# Patient Record
Sex: Female | Born: 1950 | Race: White | Hispanic: No | Smoking: Never smoker
Health system: Southern US, Community
[De-identification: ages and names within clinical notes are randomized; demographics above are authoritative.]

## PROBLEM LIST (undated history)

## (undated) DIAGNOSIS — D649 Anemia, unspecified: Secondary | ICD-10-CM

## (undated) DIAGNOSIS — T8859XA Other complications of anesthesia, initial encounter: Secondary | ICD-10-CM

## (undated) DIAGNOSIS — Z8489 Family history of other specified conditions: Secondary | ICD-10-CM

## (undated) DIAGNOSIS — Z9889 Other specified postprocedural states: Secondary | ICD-10-CM

## (undated) DIAGNOSIS — I10 Essential (primary) hypertension: Secondary | ICD-10-CM

## (undated) DIAGNOSIS — E039 Hypothyroidism, unspecified: Secondary | ICD-10-CM

## (undated) DIAGNOSIS — T4145XA Adverse effect of unspecified anesthetic, initial encounter: Secondary | ICD-10-CM

## (undated) DIAGNOSIS — F419 Anxiety disorder, unspecified: Secondary | ICD-10-CM

## (undated) DIAGNOSIS — R51 Headache: Secondary | ICD-10-CM

## (undated) DIAGNOSIS — K219 Gastro-esophageal reflux disease without esophagitis: Secondary | ICD-10-CM

## (undated) DIAGNOSIS — R519 Headache, unspecified: Secondary | ICD-10-CM

## (undated) DIAGNOSIS — R112 Nausea with vomiting, unspecified: Secondary | ICD-10-CM

## (undated) HISTORY — PX: COLONOSCOPY: SHX174

## (undated) HISTORY — PX: CARPAL TUNNEL RELEASE: SHX101

## (undated) HISTORY — PX: COLONOSCOPY: SHX5424

## (undated) HISTORY — PX: ABDOMINAL HYSTERECTOMY: SHX81

---

## 2004-07-25 ENCOUNTER — Ambulatory Visit: Payer: Self-pay | Admitting: Family Medicine

## 2005-03-22 ENCOUNTER — Ambulatory Visit: Payer: Self-pay | Admitting: Unknown Physician Specialty

## 2006-07-30 ENCOUNTER — Ambulatory Visit: Payer: Self-pay | Admitting: Family Medicine

## 2007-02-16 ENCOUNTER — Emergency Department: Payer: Self-pay | Admitting: Emergency Medicine

## 2007-02-19 ENCOUNTER — Ambulatory Visit: Payer: Self-pay | Admitting: Unknown Physician Specialty

## 2007-03-25 ENCOUNTER — Ambulatory Visit: Payer: Self-pay | Admitting: Pain Medicine

## 2007-03-26 ENCOUNTER — Ambulatory Visit: Payer: Self-pay | Admitting: Pain Medicine

## 2007-04-15 ENCOUNTER — Ambulatory Visit: Payer: Self-pay | Admitting: Physician Assistant

## 2007-04-22 ENCOUNTER — Ambulatory Visit: Payer: Self-pay | Admitting: Pain Medicine

## 2007-05-08 ENCOUNTER — Ambulatory Visit: Payer: Self-pay | Admitting: Physician Assistant

## 2007-05-22 ENCOUNTER — Ambulatory Visit: Payer: Self-pay | Admitting: Pain Medicine

## 2007-06-09 ENCOUNTER — Ambulatory Visit: Payer: Self-pay | Admitting: Physician Assistant

## 2007-08-26 ENCOUNTER — Ambulatory Visit: Payer: Self-pay | Admitting: Family Medicine

## 2009-03-15 ENCOUNTER — Ambulatory Visit: Payer: Self-pay | Admitting: Family Medicine

## 2010-03-15 ENCOUNTER — Ambulatory Visit: Payer: Self-pay | Admitting: Family Medicine

## 2010-03-16 ENCOUNTER — Ambulatory Visit: Payer: Self-pay | Admitting: Family Medicine

## 2012-04-01 ENCOUNTER — Ambulatory Visit: Payer: Self-pay | Admitting: Family Medicine

## 2012-07-11 ENCOUNTER — Ambulatory Visit: Payer: Self-pay | Admitting: Unknown Physician Specialty

## 2012-07-27 ENCOUNTER — Emergency Department: Payer: Self-pay | Admitting: Internal Medicine

## 2012-08-19 ENCOUNTER — Ambulatory Visit: Payer: Self-pay

## 2012-09-10 HISTORY — PX: BACK SURGERY: SHX140

## 2013-05-08 ENCOUNTER — Ambulatory Visit: Payer: Self-pay | Admitting: Neurosurgery

## 2013-05-08 LAB — CREATININE, SERUM
Creatinine: 0.84 mg/dL (ref 0.60–1.30)
EGFR (African American): >60
EGFR (Non-African Amer.): >60

## 2015-02-24 ENCOUNTER — Other Ambulatory Visit: Payer: Self-pay | Admitting: Family Medicine

## 2015-02-24 DIAGNOSIS — Z1231 Encounter for screening mammogram for malignant neoplasm of breast: Secondary | ICD-10-CM

## 2015-02-28 ENCOUNTER — Ambulatory Visit: Payer: Self-pay

## 2017-03-04 ENCOUNTER — Other Ambulatory Visit: Payer: Self-pay | Admitting: Family Medicine

## 2017-03-04 DIAGNOSIS — Z1231 Encounter for screening mammogram for malignant neoplasm of breast: Secondary | ICD-10-CM

## 2017-03-21 ENCOUNTER — Ambulatory Visit
Admission: RE | Admit: 2017-03-21 | Discharge: 2017-03-21 | Disposition: A | Discharge status: Home / Self Care | Payer: BLUE CROSS/BLUE SHIELD | Source: Ambulatory Visit | Attending: Family Medicine | Admitting: Family Medicine

## 2017-03-21 DIAGNOSIS — Z1231 Encounter for screening mammogram for malignant neoplasm of breast: Secondary | ICD-10-CM | POA: Insufficient documentation

## 2017-03-25 ENCOUNTER — Other Ambulatory Visit: Payer: Self-pay | Admitting: *Deleted

## 2017-03-25 ENCOUNTER — Inpatient Hospital Stay
Admission: RE | Admit: 2017-03-25 | Discharge: 2017-03-25 | Disposition: A | Discharge status: Home / Self Care | Payer: Self-pay | Source: Ambulatory Visit | Attending: *Deleted | Admitting: *Deleted

## 2017-03-25 ENCOUNTER — Other Ambulatory Visit: Payer: Self-pay | Admitting: Physician Assistant

## 2017-03-25 DIAGNOSIS — Z9289 Personal history of other medical treatment: Secondary | ICD-10-CM

## 2017-03-25 DIAGNOSIS — M7541 Impingement syndrome of right shoulder: Secondary | ICD-10-CM

## 2017-04-02 ENCOUNTER — Ambulatory Visit: Payer: Medicare Other

## 2017-05-08 ENCOUNTER — Other Ambulatory Visit: Payer: Self-pay | Admitting: Family Medicine

## 2017-05-08 DIAGNOSIS — Z780 Asymptomatic menopausal state: Secondary | ICD-10-CM

## 2017-06-03 ENCOUNTER — Other Ambulatory Visit: Payer: Self-pay | Admitting: Surgery

## 2017-06-03 DIAGNOSIS — M7541 Impingement syndrome of right shoulder: Secondary | ICD-10-CM

## 2017-06-03 DIAGNOSIS — M7581 Other shoulder lesions, right shoulder: Secondary | ICD-10-CM

## 2017-06-10 ENCOUNTER — Ambulatory Visit
Admission: RE | Admit: 2017-06-10 | Discharge: 2017-06-10 | Disposition: A | Discharge status: Home / Self Care | Payer: BLUE CROSS/BLUE SHIELD | Source: Ambulatory Visit | Attending: Family Medicine | Admitting: Family Medicine

## 2017-06-10 ENCOUNTER — Encounter: Payer: Self-pay | Admitting: Radiology

## 2017-06-10 DIAGNOSIS — M7541 Impingement syndrome of right shoulder: Secondary | ICD-10-CM | POA: Insufficient documentation

## 2017-06-10 DIAGNOSIS — M85851 Other specified disorders of bone density and structure, right thigh: Secondary | ICD-10-CM | POA: Diagnosis not present

## 2017-06-10 DIAGNOSIS — E039 Hypothyroidism, unspecified: Secondary | ICD-10-CM | POA: Insufficient documentation

## 2017-06-10 DIAGNOSIS — M7581 Other shoulder lesions, right shoulder: Secondary | ICD-10-CM | POA: Insufficient documentation

## 2017-06-10 DIAGNOSIS — M25411 Effusion, right shoulder: Secondary | ICD-10-CM | POA: Insufficient documentation

## 2017-06-10 DIAGNOSIS — Z780 Asymptomatic menopausal state: Secondary | ICD-10-CM

## 2017-06-10 DIAGNOSIS — M7551 Bursitis of right shoulder: Secondary | ICD-10-CM | POA: Insufficient documentation

## 2017-06-10 DIAGNOSIS — M75101 Unspecified rotator cuff tear or rupture of right shoulder, not specified as traumatic: Secondary | ICD-10-CM | POA: Insufficient documentation

## 2017-06-11 ENCOUNTER — Ambulatory Visit
Admission: RE | Admit: 2017-06-11 | Discharge: 2017-06-11 | Disposition: A | Discharge status: Home / Self Care | Payer: BLUE CROSS/BLUE SHIELD | Source: Ambulatory Visit | Attending: Surgery | Admitting: Surgery

## 2017-06-11 DIAGNOSIS — M7581 Other shoulder lesions, right shoulder: Secondary | ICD-10-CM

## 2017-06-11 DIAGNOSIS — Z780 Asymptomatic menopausal state: Secondary | ICD-10-CM | POA: Diagnosis not present

## 2017-06-11 DIAGNOSIS — M7541 Impingement syndrome of right shoulder: Secondary | ICD-10-CM

## 2017-07-22 ENCOUNTER — Encounter
Admission: RE | Admit: 2017-07-22 | Discharge: 2017-07-22 | Disposition: A | Discharge status: Home / Self Care | Payer: BLUE CROSS/BLUE SHIELD | Source: Ambulatory Visit | Attending: Surgery | Admitting: Surgery

## 2017-07-22 ENCOUNTER — Other Ambulatory Visit: Payer: Self-pay

## 2017-07-22 HISTORY — DX: Adverse effect of unspecified anesthetic, initial encounter: T41.45XA

## 2017-07-22 HISTORY — DX: Anxiety disorder, unspecified: F41.9

## 2017-07-22 HISTORY — DX: Essential (primary) hypertension: I10

## 2017-07-22 HISTORY — DX: Anemia, unspecified: D64.9

## 2017-07-22 HISTORY — DX: Headache, unspecified: R51.9

## 2017-07-22 HISTORY — DX: Nausea with vomiting, unspecified: R11.2

## 2017-07-22 HISTORY — DX: Other specified postprocedural states: Z98.890

## 2017-07-22 HISTORY — DX: Other complications of anesthesia, initial encounter: T88.59XA

## 2017-07-22 HISTORY — DX: Headache: R51

## 2017-07-22 HISTORY — DX: Family history of other specified conditions: Z84.89

## 2017-07-22 HISTORY — DX: Hypothyroidism, unspecified: E03.9

## 2017-07-22 HISTORY — DX: Gastro-esophageal reflux disease without esophagitis: K21.9

## 2017-07-22 NOTE — Patient Instructions (Signed)
Your procedure is scheduled on: 07-30-17 TUESDAY Report to Same Day Surgery 2nd floor medical mall [facility] Entrance-take elevator on left to 2nd floor.  Check in with surgery information desk.) To find out your arrival time please call [phone] between 1PM - 3PM on 07-29-17 MONDAY  Remember: Instructions that are not followed completely may result in serious medical risk, up to and including death, or upon the discretion of your surgeon and anesthesiologist your surgery may need to be rescheduled.    _x___ 1. Do not eat food after midnight the night before your procedure. NO GUM CHEWING OR CANDY AFTER MIDNIGHT.  You may drink clear liquids up to 2 hours before you are scheduled to arrive at the hospital for your procedure.  Do not drink clear liquids within 2 hours of your scheduled arrival to the hospital.  Clear liquids include  --Water or Apple juice without pulp  --Clear carbohydrate beverage such as ClearFast or Gatorade  --Black Coffee or Clear Tea (No milk, no creamers, do not add anything to the coffee or Tea     __x__ 2. No Alcohol for 24 hours before or after surgery.   __x__3. No Smoking for 24 prior to surgery.   ____  4. Bring all medications with you on the day of surgery if instructed.    __x__ 5. Notify your doctor if there is any change in your medical condition     (cold, fever, infections).     Do not wear jewelry, make-up, hairpins, clips or nail polish.  Do not wear lotions, powders, or perfumes. You may wear deodorant.  Do not shave 48 hours prior to surgery. Men may shave face and neck.  Do not bring valuables to the hospital.    [facility] is not responsible for any belongings or valuables.               Contacts, dentures or bridgework may not be worn into surgery.  Leave your suitcase in the car. After surgery it may be brought to your room.  For patients admitted to the hospital, discharge time is determined by your treatment  team.   Patients discharged the day of surgery will not be allowed to drive home.  You will need someone to drive you home and stay with you the night of your procedure.    Please read over the following fact sheets that you were given:   [facility] Preparing for Surgery and or MRSA Information   _x___ TAKE THE FOLLOWING MEDICATIONS THE MORNING OF SURGERY WITH A SMALL SIP OF WATER. These include:  1. SYNTHROID  2. LEXAPRO  3. MAY TAKE XANAX AM OF SURGERY IF NEEDED  4.  5.  6.  ____Fleets enema or Magnesium Citrate as directed.   _x___ Use CHG Soap or sage wipes as directed on instruction sheet   ____ Use inhalers on the day of surgery and bring to hospital day of surgery  ____ Stop Metformin and Janumet 2 days prior to surgery.    ____ Take 1/2 of usual insulin dose the night before surgery and none on the morning surgery.   ____ Follow recommendations from Cardiologist, Pulmonologist or PCP regarding stopping Aspirin, Coumadin, Plavix ,Eliquis, Effient, or Pradaxa, and Pletal.  X____Stop Anti-inflammatories such as Advil, Aleve, IBUPROFEN, Motrin, Naproxen, Naprosyn, Goodies powders or aspirin products NOW- OK to take Tylenol    _x___ Stop supplements until after surgery-STOP MOVE FREE (GLUCOSAMINE-CHONDROITIN) NOW-MAY RESUME AFTER SURGERY   ____ Bring C-Pap  to the hospital.

## 2017-07-23 ENCOUNTER — Encounter
Admission: RE | Admit: 2017-07-23 | Discharge: 2017-07-23 | Disposition: A | Discharge status: Home / Self Care | Payer: BLUE CROSS/BLUE SHIELD | Source: Ambulatory Visit | Attending: Surgery | Admitting: Surgery

## 2017-07-23 DIAGNOSIS — I10 Essential (primary) hypertension: Secondary | ICD-10-CM | POA: Insufficient documentation

## 2017-07-23 DIAGNOSIS — Z01810 Encounter for preprocedural cardiovascular examination: Secondary | ICD-10-CM | POA: Insufficient documentation

## 2017-07-27 ENCOUNTER — Encounter: Payer: Self-pay | Admitting: Anesthesiology

## 2017-07-29 MED ORDER — CLINDAMYCIN PHOSPHATE 900 MG/50ML IV SOLN
900.0000 mg | Freq: Once | INTRAVENOUS | Status: AC
  Administered 2017-07-30: 900 mg via INTRAVENOUS

## 2017-07-30 ENCOUNTER — Ambulatory Visit: Payer: BLUE CROSS/BLUE SHIELD | Admitting: Anesthesiology

## 2017-07-30 ENCOUNTER — Ambulatory Visit
Admission: RE | Admit: 2017-07-30 | Discharge: 2017-07-30 | Disposition: A | Discharge status: Home / Self Care | Payer: BLUE CROSS/BLUE SHIELD | Source: Ambulatory Visit | Attending: Surgery | Admitting: Surgery

## 2017-07-30 ENCOUNTER — Encounter: Payer: Self-pay | Admitting: *Deleted

## 2017-07-30 ENCOUNTER — Encounter
Admission: RE | Disposition: A | Discharge status: Home / Self Care | Payer: Self-pay | Source: Ambulatory Visit | Attending: Surgery

## 2017-07-30 ENCOUNTER — Other Ambulatory Visit: Payer: Self-pay

## 2017-07-30 DIAGNOSIS — Z820 Family history of epilepsy and other diseases of the nervous system: Secondary | ICD-10-CM | POA: Diagnosis not present

## 2017-07-30 DIAGNOSIS — Z90710 Acquired absence of both cervix and uterus: Secondary | ICD-10-CM | POA: Insufficient documentation

## 2017-07-30 DIAGNOSIS — R51 Headache: Secondary | ICD-10-CM | POA: Diagnosis not present

## 2017-07-30 DIAGNOSIS — Z8249 Family history of ischemic heart disease and other diseases of the circulatory system: Secondary | ICD-10-CM | POA: Insufficient documentation

## 2017-07-30 DIAGNOSIS — Z79899 Other long term (current) drug therapy: Secondary | ICD-10-CM | POA: Diagnosis not present

## 2017-07-30 DIAGNOSIS — E039 Hypothyroidism, unspecified: Secondary | ICD-10-CM | POA: Diagnosis not present

## 2017-07-30 DIAGNOSIS — M659 Synovitis and tenosynovitis, unspecified: Secondary | ICD-10-CM | POA: Insufficient documentation

## 2017-07-30 DIAGNOSIS — F419 Anxiety disorder, unspecified: Secondary | ICD-10-CM | POA: Insufficient documentation

## 2017-07-30 DIAGNOSIS — K219 Gastro-esophageal reflux disease without esophagitis: Secondary | ICD-10-CM | POA: Insufficient documentation

## 2017-07-30 DIAGNOSIS — Z882 Allergy status to sulfonamides status: Secondary | ICD-10-CM | POA: Insufficient documentation

## 2017-07-30 DIAGNOSIS — M19011 Primary osteoarthritis, right shoulder: Secondary | ICD-10-CM | POA: Insufficient documentation

## 2017-07-30 DIAGNOSIS — M67911 Unspecified disorder of synovium and tendon, right shoulder: Secondary | ICD-10-CM | POA: Diagnosis not present

## 2017-07-30 DIAGNOSIS — I10 Essential (primary) hypertension: Secondary | ICD-10-CM | POA: Diagnosis not present

## 2017-07-30 DIAGNOSIS — Z8371 Family history of colonic polyps: Secondary | ICD-10-CM | POA: Insufficient documentation

## 2017-07-30 DIAGNOSIS — M7541 Impingement syndrome of right shoulder: Secondary | ICD-10-CM | POA: Diagnosis not present

## 2017-07-30 DIAGNOSIS — Z823 Family history of stroke: Secondary | ICD-10-CM | POA: Diagnosis not present

## 2017-07-30 DIAGNOSIS — M75101 Unspecified rotator cuff tear or rupture of right shoulder, not specified as traumatic: Secondary | ICD-10-CM | POA: Diagnosis not present

## 2017-07-30 DIAGNOSIS — Z888 Allergy status to other drugs, medicaments and biological substances status: Secondary | ICD-10-CM | POA: Diagnosis not present

## 2017-07-30 HISTORY — PX: SHOULDER ARTHROSCOPY WITH ROTATOR CUFF REPAIR: SHX5685

## 2017-07-30 SURGERY — ARTHROSCOPY, SHOULDER, WITH ROTATOR CUFF REPAIR
Anesthesia: General | Site: Shoulder | Laterality: Right | Wound class: Clean

## 2017-07-30 MED ORDER — BUPIVACAINE LIPOSOME 1.3 % IJ SUSP
INTRAMUSCULAR | Status: DC | PRN
  Administered 2017-07-30: 20 mL via PERINEURAL

## 2017-07-30 MED ORDER — SUGAMMADEX SODIUM 200 MG/2ML IV SOLN
INTRAVENOUS | Status: DC | PRN
  Administered 2017-07-30: 200 mg via INTRAVENOUS

## 2017-07-30 MED ORDER — BUPIVACAINE HCL (PF) 0.5 % IJ SOLN
INTRAMUSCULAR | Status: AC
Start: 2017-07-30 — End: 2017-07-30
  Filled 2017-07-30: qty 30

## 2017-07-30 MED ORDER — BUPIVACAINE-EPINEPHRINE (PF) 0.5% -1:200000 IJ SOLN
INTRAMUSCULAR | Status: AC
  Filled 2017-07-30: qty 30

## 2017-07-30 MED ORDER — DEXAMETHASONE SODIUM PHOSPHATE 4 MG/ML IJ SOLN
INTRAMUSCULAR | Status: DC | PRN
  Administered 2017-07-30: 5 mg via INTRAVENOUS

## 2017-07-30 MED ORDER — FENTANYL CITRATE (PF) 250 MCG/5ML IJ SOLN
INTRAMUSCULAR | Status: AC
  Filled 2017-07-30: qty 5

## 2017-07-30 MED ORDER — MIDAZOLAM HCL 2 MG/2ML IJ SOLN
1.0000 mg | Freq: Once | INTRAMUSCULAR | Status: AC
  Administered 2017-07-30: 1 mg via INTRAVENOUS

## 2017-07-30 MED ORDER — ONDANSETRON 4 MG PO TBDP: 4.0000 mg | ORAL_TABLET | Freq: Three times a day (TID) | ORAL | 2 refills | Status: DC | PRN

## 2017-07-30 MED ORDER — ROCURONIUM BROMIDE 50 MG/5ML IV SOLN
INTRAVENOUS | Status: AC
  Filled 2017-07-30: qty 1

## 2017-07-30 MED ORDER — BUPIVACAINE-EPINEPHRINE (PF) 0.5% -1:200000 IJ SOLN
INTRAMUSCULAR | Status: DC | PRN
  Administered 2017-07-30: 30 mL via PERINEURAL

## 2017-07-30 MED ORDER — FENTANYL CITRATE (PF) 100 MCG/2ML IJ SOLN
INTRAMUSCULAR | Status: AC
  Administered 2017-07-30: 50 ug via INTRAVENOUS
  Filled 2017-07-30: qty 2

## 2017-07-30 MED ORDER — LIDOCAINE HCL (PF) 1 % IJ SOLN
INTRAMUSCULAR | Status: AC
  Filled 2017-07-30: qty 5

## 2017-07-30 MED ORDER — FAMOTIDINE 20 MG PO TABS
20.0000 mg | ORAL_TABLET | Freq: Once | ORAL | Status: AC
  Administered 2017-07-30: 20 mg via ORAL

## 2017-07-30 MED ORDER — PROPOFOL 10 MG/ML IV BOLUS
INTRAVENOUS | Status: DC | PRN
  Administered 2017-07-30: 100 mg via INTRAVENOUS
  Administered 2017-07-30: 150 mg via INTRAVENOUS

## 2017-07-30 MED ORDER — OXYCODONE HCL 5 MG PO TABS: 5.0000 mg | ORAL_TABLET | ORAL | 0 refills | Status: DC | PRN

## 2017-07-30 MED ORDER — LACTATED RINGERS IV SOLN
INTRAVENOUS | Status: DC
  Administered 2017-07-30: 09:00:00 via INTRAVENOUS

## 2017-07-30 MED ORDER — EPINEPHRINE PF 1 MG/ML IJ SOLN
INTRAMUSCULAR | Status: DC | PRN
Start: 2017-07-30 — End: 2017-07-30
  Administered 2017-07-30: 2 mL

## 2017-07-30 MED ORDER — ROCURONIUM BROMIDE 100 MG/10ML IV SOLN
INTRAVENOUS | Status: DC | PRN
  Administered 2017-07-30: 50 mg via INTRAVENOUS

## 2017-07-30 MED ORDER — BUPIVACAINE LIPOSOME 1.3 % IJ SUSP
INTRAMUSCULAR | Status: AC
  Filled 2017-07-30: qty 20

## 2017-07-30 MED ORDER — FENTANYL CITRATE (PF) 100 MCG/2ML IJ SOLN
50.0000 ug | Freq: Once | INTRAMUSCULAR | Status: AC
  Administered 2017-07-30: 50 ug via INTRAVENOUS

## 2017-07-30 MED ORDER — DEXAMETHASONE SODIUM PHOSPHATE 10 MG/ML IJ SOLN
INTRAMUSCULAR | Status: AC
  Filled 2017-07-30: qty 1

## 2017-07-30 MED ORDER — CLINDAMYCIN PHOSPHATE 900 MG/50ML IV SOLN
INTRAVENOUS | Status: AC
  Filled 2017-07-30: qty 50

## 2017-07-30 MED ORDER — LIDOCAINE HCL (PF) 1 % IJ SOLN
INTRAMUSCULAR | Status: DC | PRN
  Administered 2017-07-30: 3 mL

## 2017-07-30 MED ORDER — MIDAZOLAM HCL 2 MG/2ML IJ SOLN
INTRAMUSCULAR | Status: AC
  Filled 2017-07-30: qty 2

## 2017-07-30 MED ORDER — MIDAZOLAM HCL 2 MG/2ML IJ SOLN
INTRAMUSCULAR | Status: AC
  Administered 2017-07-30: 1 mg via INTRAVENOUS
  Filled 2017-07-30: qty 2

## 2017-07-30 MED ORDER — FENTANYL CITRATE (PF) 100 MCG/2ML IJ SOLN
INTRAMUSCULAR | Status: DC | PRN
  Administered 2017-07-30: 50 ug via INTRAVENOUS
  Administered 2017-07-30: 100 ug via INTRAVENOUS
  Administered 2017-07-30: 50 ug via INTRAVENOUS

## 2017-07-30 MED ORDER — PHENYLEPHRINE HCL 10 MG/ML IJ SOLN
INTRAMUSCULAR | Status: DC | PRN
  Administered 2017-07-30: 100 ug via INTRAVENOUS

## 2017-07-30 MED ORDER — FAMOTIDINE 20 MG PO TABS
ORAL_TABLET | ORAL | Status: AC
  Filled 2017-07-30: qty 1

## 2017-07-30 MED ORDER — PHENYLEPHRINE HCL 10 MG/ML IJ SOLN
INTRAVENOUS | Status: DC | PRN
  Administered 2017-07-30: 30 ug/min via INTRAVENOUS

## 2017-07-30 MED ORDER — LIDOCAINE HCL (CARDIAC) 20 MG/ML IV SOLN
INTRAVENOUS | Status: DC | PRN
  Administered 2017-07-30: 100 mg via INTRAVENOUS

## 2017-07-30 MED ORDER — BUPIVACAINE HCL (PF) 0.5 % IJ SOLN
INTRAMUSCULAR | Status: DC | PRN
  Administered 2017-07-30: 10 mL via PERINEURAL

## 2017-07-30 MED ORDER — ONDANSETRON HCL 4 MG/2ML IJ SOLN
INTRAMUSCULAR | Status: DC | PRN
  Administered 2017-07-30: 4 mg via INTRAVENOUS

## 2017-07-30 MED ORDER — MIDAZOLAM HCL 2 MG/2ML IJ SOLN
INTRAMUSCULAR | Status: DC | PRN
  Administered 2017-07-30: 1 mg via INTRAVENOUS

## 2017-07-30 MED ORDER — EPINEPHRINE PF 1 MG/ML IJ SOLN
INTRAMUSCULAR | Status: AC
  Filled 2017-07-30: qty 2

## 2017-07-30 MED ORDER — LIDOCAINE HCL (PF) 2 % IJ SOLN
INTRAMUSCULAR | Status: AC
Start: 2017-07-30 — End: 2017-07-30
  Filled 2017-07-30: qty 10

## 2017-07-30 SURGICAL SUPPLY — 54 items
ANCHOR JUGGERKNOT WTAP NDL 2.9 (Anchor) ×2 IMPLANT
ANCHOR SUT W/ ORTHOCORD (Anchor) ×2 IMPLANT
ANCHOR TENDON REGENETEN (Staple) ×2 IMPLANT
ANCHORS BONE REGENETEN (Anchor) ×2 IMPLANT
BIT DRILL JUGRKNT W/NDL BIT2.9 (DRILL) ×1 IMPLANT
BLADE FULL RADIUS 3.5 (BLADE) ×2 IMPLANT
BUR ACROMIONIZER 4.0 (BURR) ×2 IMPLANT
CANNULA SHAVER 8MMX76MM (CANNULA) ×4 IMPLANT
CHLORAPREP W/TINT 26ML (MISCELLANEOUS) ×2 IMPLANT
COVER MAYO STAND STRL (DRAPES) IMPLANT
DRAPE IMP U-DRAPE 54X76 (DRAPES) ×4 IMPLANT
DRILL JUGGERKNOT W/NDL BIT 2.9 (DRILL) ×2
DRSG OPSITE POSTOP 4X8 (GAUZE/BANDAGES/DRESSINGS) ×2 IMPLANT
ELECT REM PT RETURN 9FT ADLT (ELECTROSURGICAL) ×2
ELECTRODE REM PT RTRN 9FT ADLT (ELECTROSURGICAL) ×1 IMPLANT
GAUZE PETRO XEROFOAM 1X8 (MISCELLANEOUS) ×2 IMPLANT
GAUZE SPONGE 4X4 12PLY STRL (GAUZE/BANDAGES/DRESSINGS) ×2 IMPLANT
GLOVE BIO SURGEON STRL SZ7.5 (GLOVE) ×4 IMPLANT
GLOVE BIO SURGEON STRL SZ8 (GLOVE) ×4 IMPLANT
GLOVE BIOGEL PI IND STRL 8 (GLOVE) ×1 IMPLANT
GLOVE BIOGEL PI INDICATOR 8 (GLOVE) ×1
GLOVE INDICATOR 8.0 STRL GRN (GLOVE) ×2 IMPLANT
GOWN STRL REUS W/ TWL LRG LVL3 (GOWN DISPOSABLE) ×1 IMPLANT
GOWN STRL REUS W/ TWL XL LVL3 (GOWN DISPOSABLE) ×1 IMPLANT
GOWN STRL REUS W/TWL LRG LVL3 (GOWN DISPOSABLE) ×1
GOWN STRL REUS W/TWL XL LVL3 (GOWN DISPOSABLE) ×1
GRASPER SUT 15 45D LOW PRO (SUTURE) ×2 IMPLANT
IMPLANT REGENETEN MEDIUM (Shoulder) ×2 IMPLANT
IV LACTATED RINGER IRRG 3000ML (IV SOLUTION) ×2
IV LR IRRIG 3000ML ARTHROMATIC (IV SOLUTION) ×2 IMPLANT
KIT CANNULA 8X76-LX IN CANNULA (CANNULA) ×2 IMPLANT
MANIFOLD NEPTUNE II (INSTRUMENTS) ×2 IMPLANT
MASK FACE SPIDER DISP (MASK) ×2 IMPLANT
MAT BLUE FLOOR 46X72 FLO (MISCELLANEOUS) ×2 IMPLANT
NDL MAYO CATGUT SZ4 (NEEDLE) IMPLANT
NDL MAYO CATGUT SZ5 (NEEDLE)
NDL SUT 5 .5 CRC TPR PNT MAYO (NEEDLE) IMPLANT
NEEDLE HYPO 22GX1.5 SAFETY (NEEDLE) ×2 IMPLANT
NEEDLE MAYO 6 CRC TAPER PT (NEEDLE) IMPLANT
NEEDLE MAYO CATGUT SZ 1.5 (NEEDLE)
NEEDLE MAYO CATGUT SZ 2 (NEEDLE) IMPLANT
NEEDLE REVERSE CUT 1/2 CRC (NEEDLE) IMPLANT
PACK ARTHROSCOPY SHOULDER (MISCELLANEOUS) ×2 IMPLANT
SLING ARM LRG DEEP (SOFTGOODS) ×2 IMPLANT
SLING ULTRA II LG (MISCELLANEOUS) ×2 IMPLANT
STAPLER SKIN PROX 35W (STAPLE) ×2 IMPLANT
STRAP SAFETY BODY (MISCELLANEOUS) ×2 IMPLANT
SUT ETHIBOND 0 MO6 C/R (SUTURE) ×2 IMPLANT
SUT VIC AB 2-0 CT1 27 (SUTURE) ×2
SUT VIC AB 2-0 CT1 TAPERPNT 27 (SUTURE) ×2 IMPLANT
TAPE MICROFOAM 4IN (TAPE) ×2 IMPLANT
TUBING ARTHRO INFLOW-ONLY STRL (TUBING) ×2 IMPLANT
TUBING CONNECTING 10 (TUBING) ×2 IMPLANT
WAND HAND CNTRL MULTIVAC 90 (MISCELLANEOUS) ×2 IMPLANT

## 2017-07-30 NOTE — Anesthesia Procedure Notes (Signed)
Procedure Name: Intubation Date/Time: 07/30/2017 10:40 AM Performed by: Bernardo Heater, CRNA Pre-anesthesia Checklist: Patient identified, Emergency Drugs available, Suction available and Patient being monitored Patient Re-evaluated:Patient Re-evaluated prior to induction Oxygen Delivery Method: Circle system utilized Preoxygenation: Pre-oxygenation with 100% oxygen Induction Type: IV induction Laryngoscope Size: Mac and 3 Grade View: Grade II Tube type: Oral Tube size: 7.0 mm Number of attempts: 1 Placement Confirmation: ETT inserted through vocal cords under direct vision,  positive ETCO2 and breath sounds checked- equal and bilateral Secured at: 21 cm Tube secured with: Tape Dental Injury: Teeth and Oropharynx as per pre-operative assessment

## 2017-07-30 NOTE — Transfer of Care (Signed)
Immediate Anesthesia Transfer of Care Note  Patient: SHARAY BELLISSIMO  Procedure(s) Performed: SHOULDER ARTHROSCOPY WITH DEBRIDEMENT, DECOMPRESSION, REPAIR OF THE ROTATOR CUFF TEAR WITH A REGENERATION PATCH AND BICEPS TENODESIS (Right Shoulder)  Patient Location: PACU  Anesthesia Type:General  Level of Consciousness: awake, alert , oriented and patient cooperative  Airway & Oxygen Therapy: Patient Spontanous Breathing and Patient connected to nasal cannula oxygen  Post-op Assessment: Report given to RN and Post -op Vital signs reviewed and stable  Post vital signs: Reviewed and stable  Last Vitals:  Vitals:   07/30/17 1018 07/30/17 1023  BP: 123/81 132/79  Pulse: 74 72  Resp: 17 20  Temp:    SpO2: 99% 98%    Last Pain:  Vitals:   07/30/17 0953  TempSrc:   PainSc: Asleep         Complications: No apparent anesthesia complications

## 2017-07-30 NOTE — Anesthesia Preprocedure Evaluation (Signed)
Anesthesia Evaluation  Patient identified by MRN, date of birth, ID band Patient awake    Reviewed: Allergy & Precautions, NPO status , Patient's Chart, lab work & pertinent test results  History of Anesthesia Complications (+) PONV and history of anesthetic complications  Airway Mallampati: II  TM Distance: >3 FB Neck ROM: Full    Dental no notable dental hx.    Pulmonary neg pulmonary ROS, neg sleep apnea, neg COPD,    breath sounds clear to auscultation- rhonchi (-) wheezing      Cardiovascular hypertension, Pt. on medications (-) CAD, (-) Past MI and (-) Cardiac Stents  Rhythm:Regular Rate:Normal - Systolic murmurs and - Diastolic murmurs    Neuro/Psych  Headaches, Anxiety    GI/Hepatic Neg liver ROS, GERD  ,  Endo/Other  neg diabetesHypothyroidism   Renal/GU negative Renal ROS     Musculoskeletal negative musculoskeletal ROS (+)   Abdominal (+) - obese,   Peds  Hematology  (+) anemia ,   Anesthesia Other Findings Past Medical History: No date: Anemia     Comment:  H/O No date: Anxiety No date: Complication of anesthesia No date: Family history of adverse reaction to anesthesia     Comment:  MOM-N/V No date: GERD (gastroesophageal reflux disease)     Comment:  OCC-TAKES DGL CHEWABLE No date: Headache No date: Hypertension No date: Hypothyroidism No date: PONV (postoperative nausea and vomiting)     Comment:  DID NOT GET SICK WITH BACK SURGERY   Reproductive/Obstetrics                             Anesthesia Physical Anesthesia Plan  ASA: II  Anesthesia Plan: General   Post-op Pain Management:  Regional for Post-op pain   Induction: Intravenous  PONV Risk Score and Plan: 3 and Midazolam, Dexamethasone and Ondansetron  Airway Management Planned: Oral ETT  Additional Equipment:   Intra-op Plan:   Post-operative Plan: Extubation in OR  Informed Consent: I have  reviewed the patients History and Physical, chart, labs and discussed the procedure including the risks, benefits and alternatives for the proposed anesthesia with the patient or authorized representative who has indicated his/her understanding and acceptance.   Dental advisory given  Plan Discussed with: CRNA and Anesthesiologist  Anesthesia Plan Comments:         Anesthesia Quick Evaluation

## 2017-07-30 NOTE — Anesthesia Procedure Notes (Signed)
Anesthesia Regional Block: Interscalene brachial plexus block   Pre-Anesthetic Checklist: ,, timeout performed, Correct Patient, Correct Site, Correct Laterality, Correct Procedure, Correct Position, site marked, Risks and benefits discussed,  Surgical consent,  Pre-op evaluation,  At surgeon's request and post-op pain management  Laterality: Right  Prep: chloraprep       Needles:  Injection technique: Single-shot  Needle Type: Stimiplex     Needle Length: 10cm  Needle Gauge: 21     Additional Needles:   Procedures:,,,, ultrasound used (permanent image in chart),,,,  Narrative:  Start time: 07/30/2017 9:55 AM End time: 07/30/2017 10:00 AM Injection made incrementally with aspirations every 5 mL.  Performed by: Personally  Anesthesiologist: Emmie Niemann, MD  Additional Notes: Functioning IV was confirmed and monitors were applied.  A Stimuplex needle was used. Sterile prep and drape,hand hygiene and sterile gloves were used.  Negative aspiration and negative test dose prior to incremental administration of local anesthetic. The patient tolerated the procedure well.

## 2017-07-30 NOTE — H&P (Signed)
Paper H&P to be scanned into permanent record. H&P reviewed and patient re-examined. No changes. 

## 2017-07-30 NOTE — Discharge Instructions (Signed)
Keep dressing dry and intact.  May shower after dressing changed on post-op day #4 (Saturday).  Cover staples with Band-Aids after drying off. Apply ice frequently to shoulder. Take ibuprofen 600-800 mg TID with meals for 7-10 days, then as necessary. Take oxycodone as prescribed when needed.  May supplement with ES Tylenol if necessary. Keep shoulder immobilizer on at all times except may remove for bathing purposes. Follow-up in 10-14 days or as scheduled.

## 2017-07-30 NOTE — Anesthesia Post-op Follow-up Note (Signed)
Anesthesia QCDR form completed.        

## 2017-07-30 NOTE — Op Note (Signed)
07/30/2017  12:14 PM  Patient:   Sabrina Christian  Pre-Op Diagnosis:   Impingement/tendinopathy with partial-thickness rotator cuff tear and biceps tendinopathy, right shoulder.  Post-Op Diagnosis: Impingement/tendinopathy with partial-thickness rotator cuff tear, biceps tendinopathy, degenerative labral fraying, synovitis, and early degenerative joint disease, right shoulder.  Procedure: Extensive arthroscopic debridement, arthroscopic repair of partial thickness subscapularis tendon tear, subacromial decompression, mini-open repair of partial thickness supraspinatus tendon tear with Regeneron patch, and mini-open biceps tenodesis, right shoulder.  Anesthesia: General endotracheal with interscalene block placed preoperatively by the anesthesiologist.  Surgeon:   Pascal Lux, MD  Assistant:   Cameron Proud, PA-C  Findings: As above.  There was a partial-thickness articular sided tear of the superior insertional fibers of the subscapularis tendon, as well as a partial-thickness articular sided tear of the supraspinatus tendon, involving approximately 40% of the footprint.  The labrum demonstrated moderate degenerative tearing/fraying anteriorly, superiorly, and posterior superiorly with a small detachment of the superior labrum from its glenoid anchor.  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  Complications: None  Fluids:   850 cc  Estimated blood loss: 5 cc  Tourniquet time: None  Drains: None  Closure: Staples   Brief clinical note: The patient is a 66 year old female with a history of progressively worsening right shoulder pain. The patient's symptoms have progressed despite medications, activity modification, etc. The patient's history and examination are consistent with  impingement/tendinopathy with a possible rotator cuff tear. An MRI scan confirmed the presence of partial-thickness tear of the supraspinatus tendon, as well as biceps tendinopathy and mild glenohumeral degenerative changes. The patient presents at this time for definitive management of these shoulder symptoms.  Procedure: The patient underwent placement of an Exparel interscalene block by the anesthesiologist in the preoperative holding area before being brought into the operating room and lain in the supine position. The patient then underwent general endotracheal intubation and anesthesia before being repositioned in the beach chair position using the beach chair positioner. The right shoulder and upper extremity were prepped with ChloraPrep solution before being draped sterilely. Preoperative antibiotics were administered. A timeout was performed to confirm the proper surgical site before the expected portal sites and incision site were injected with 0.5% Sensorcaine with epinephrine. A posterior portal was created and the glenohumeral joint thoroughly inspected with the findings as described above. An anterior portal was created using an outside-in technique. The labrum and rotator cuff were further probed, again confirming the above-noted findings.  The areas of labral fraying were debrided back to stable margins using the full-radius resector. In addition, the areas of partial-thickness tearing of both the subscapularis and supraspinatus tendons also were debrided back to stable margins using the full-radius resector. In addition, the exposed lesser and greater tuberosities respectively were lightly abraded to stimulate healing. The ArthroCare wand was inserted and used to release the biceps tendon from its labral anchor. It also was used to obtain hemostasis as well as to "anneal" the labrum superiorly and anteriorly. Utilizing a separate superolateral portal site created using an outside in technique as  a working portal, the torn portion of the subscapularis tendon was repaired using a single Mitek BioKnotless anchor placed through the anterior portal. The repair was deemed stable to external rotation as well as with probing. The instruments were removed from the joint after suctioning the excess  fluid.  The camera was repositioned through the posterior portal into the subacromial space. A separate lateral portal was created using an outside-in technique. The 3.5 mm full-radius resector was introduced and used to perform a subtotal bursectomy. The ArthroCare wand was then inserted and used to remove the periosteal tissue off the undersurface of the anterior third of the acromion as well as to recess the coracoacromial ligament from its attachment along the anterior and lateral margins of the acromion. The 4.0 mm acromionizing bur was introduced and used to complete the decompression by removing the undersurface of the anterior third of the acromion. The full radius resector was reintroduced to remove any residual bony debris before the ArthroCare wand was reintroduced to obtain hemostasis. The instruments were then removed from the subacromial space after suctioning the excess fluid.  An approximately 4-5 cm incision was made over the anterolateral aspect of the shoulder beginning at the anterolateral corner of the acromion and extending distally in line with the bicipital groove. This incision was carried down through the subcutaneous tissues to expose the deltoid fascia. The raphae between the anterior and middle thirds was identified and this plane developed to provide access into the subacromial space. Additional bursal tissues were debrided sharply using Metzenbaum scissors. The rotator cuff tear was carefully inspected both visually and with palpation. There was no evidence of any bursal sided fraying or tearing. However, there was an area of thinning of the rotator cuff tendon identified by palpation  over the supraspinatus tendon and corresponding to the partial-thickness articular surface tear noted arthroscopically. A medium [employer] Regeneron patch was applied over this area and secured using 8 soft tissue anchors and 2 bone anchors. Apparent stable fixation of the patch was obtained.  The bicipital groove was identified by palpation and opened for 1-1.5 cm. The biceps tendon stump was retrieved through this defect. The floor of the bicipital groove was roughened with a curet before a single Biomet 2.9 mm JuggerKnot anchor was inserted. Both sets of sutures were passed through the biceps tendon and tied securely to effect the tenodesis. The bicipital sheath was reapproximated using two #0 Ethibond interrupted sutures, incorporating the biceps tendon to further reinforce the tenodesis.  The wound was copiously irrigated with sterile saline solution before the deltoid raphae was reapproximated using 2-0 Vicryl interrupted sutures. The subcutaneous tissues were closed in two layers using 2-0 Vicryl interrupted sutures before the skin was closed using staples. The portal sites also were closed using staples. A sterile bulky dressing was applied to the shoulder before the arm was placed into a shoulder immobilizer. The patient was then awakened, extubated, and returned to the recovery room in satisfactory condition after tolerating the procedure well.

## 2017-07-30 NOTE — Progress Notes (Signed)
Capillary refill positive to right hand    Can move fingers and warm to touch

## 2017-07-31 NOTE — Anesthesia Postprocedure Evaluation (Signed)
Anesthesia Post Note  Patient: AUDRYANA HOCKENBERRY  Procedure(s) Performed: SHOULDER ARTHROSCOPY WITH DEBRIDEMENT, DECOMPRESSION, REPAIR OF THE ROTATOR CUFF TEAR WITH A REGENERATION PATCH AND BICEPS TENODESIS (Right Shoulder)  Patient location during evaluation: PACU Anesthesia Type: General Level of consciousness: awake and alert and oriented Pain management: pain level controlled Vital Signs Assessment: post-procedure vital signs reviewed and stable Respiratory status: spontaneous breathing, nonlabored ventilation and respiratory function stable Cardiovascular status: blood pressure returned to baseline and stable Postop Assessment: no signs of nausea or vomiting Anesthetic complications: no     Last Vitals:  Vitals:   07/30/17 1307 07/30/17 1346  BP: 133/86 (!) 146/76  Pulse: 77 68  Resp: 16   Temp: (!) 36.2 C   SpO2: 99% 98%    Last Pain:  Vitals:   07/30/17 1307  TempSrc: Temporal  PainSc:                  Emony Dormer

## 2017-08-07 ENCOUNTER — Encounter: Payer: Self-pay | Admitting: Surgery

## 2018-03-10 ENCOUNTER — Other Ambulatory Visit: Payer: Self-pay | Admitting: Surgery

## 2018-03-10 DIAGNOSIS — S46011S Strain of muscle(s) and tendon(s) of the rotator cuff of right shoulder, sequela: Secondary | ICD-10-CM

## 2018-03-10 DIAGNOSIS — M7521 Bicipital tendinitis, right shoulder: Secondary | ICD-10-CM

## 2018-03-10 DIAGNOSIS — M24111 Other articular cartilage disorders, right shoulder: Secondary | ICD-10-CM

## 2018-03-12 ENCOUNTER — Other Ambulatory Visit: Payer: Self-pay | Admitting: Family Medicine

## 2018-03-12 DIAGNOSIS — Z1231 Encounter for screening mammogram for malignant neoplasm of breast: Secondary | ICD-10-CM

## 2018-03-26 ENCOUNTER — Ambulatory Visit
Admission: RE | Admit: 2018-03-26 | Discharge: 2018-03-26 | Disposition: A | Discharge status: Home / Self Care | Payer: BLUE CROSS/BLUE SHIELD | Source: Ambulatory Visit | Attending: Surgery | Admitting: Surgery

## 2018-03-26 DIAGNOSIS — M7581 Other shoulder lesions, right shoulder: Secondary | ICD-10-CM | POA: Diagnosis not present

## 2018-03-26 DIAGNOSIS — S46011S Strain of muscle(s) and tendon(s) of the rotator cuff of right shoulder, sequela: Secondary | ICD-10-CM | POA: Diagnosis not present

## 2018-03-26 DIAGNOSIS — M7521 Bicipital tendinitis, right shoulder: Secondary | ICD-10-CM

## 2018-03-26 DIAGNOSIS — M24111 Other articular cartilage disorders, right shoulder: Secondary | ICD-10-CM

## 2018-03-31 ENCOUNTER — Ambulatory Visit
Admission: RE | Admit: 2018-03-31 | Discharge: 2018-03-31 | Disposition: A | Discharge status: Home / Self Care | Payer: BLUE CROSS/BLUE SHIELD | Source: Ambulatory Visit | Attending: Family Medicine | Admitting: Family Medicine

## 2018-03-31 DIAGNOSIS — Z1231 Encounter for screening mammogram for malignant neoplasm of breast: Secondary | ICD-10-CM

## 2018-10-16 ENCOUNTER — Other Ambulatory Visit: Payer: Self-pay | Admitting: Sports Medicine

## 2018-10-16 DIAGNOSIS — M1711 Unilateral primary osteoarthritis, right knee: Secondary | ICD-10-CM

## 2018-10-16 DIAGNOSIS — G8929 Other chronic pain: Secondary | ICD-10-CM

## 2018-10-16 DIAGNOSIS — M25561 Pain in right knee: Principal | ICD-10-CM

## 2018-10-16 DIAGNOSIS — M25461 Effusion, right knee: Secondary | ICD-10-CM

## 2018-10-24 ENCOUNTER — Ambulatory Visit
Admission: RE | Admit: 2018-10-24 | Discharge: 2018-10-24 | Disposition: A | Discharge status: Home / Self Care | Payer: BLUE CROSS/BLUE SHIELD | Source: Ambulatory Visit | Attending: Sports Medicine | Admitting: Sports Medicine

## 2018-10-24 DIAGNOSIS — M25461 Effusion, right knee: Secondary | ICD-10-CM | POA: Diagnosis present

## 2018-10-24 DIAGNOSIS — G8929 Other chronic pain: Secondary | ICD-10-CM | POA: Insufficient documentation

## 2018-10-24 DIAGNOSIS — M25561 Pain in right knee: Secondary | ICD-10-CM | POA: Insufficient documentation

## 2018-10-24 DIAGNOSIS — M1711 Unilateral primary osteoarthritis, right knee: Secondary | ICD-10-CM

## 2019-03-06 ENCOUNTER — Other Ambulatory Visit: Payer: Self-pay | Admitting: Family Medicine

## 2019-03-06 DIAGNOSIS — Z1231 Encounter for screening mammogram for malignant neoplasm of breast: Secondary | ICD-10-CM

## 2019-03-12 ENCOUNTER — Inpatient Hospital Stay: Admission: RE | Admit: 2019-03-12 | Payer: BLUE CROSS/BLUE SHIELD | Source: Ambulatory Visit

## 2019-03-20 ENCOUNTER — Other Ambulatory Visit: Payer: BLUE CROSS/BLUE SHIELD

## 2019-03-24 ENCOUNTER — Inpatient Hospital Stay: Admit: 2019-03-24 | Payer: BLUE CROSS/BLUE SHIELD | Admitting: Surgery

## 2019-03-24 SURGERY — ARTHROPLASTY, KNEE, TOTAL
Anesthesia: Choice | Laterality: Right

## 2019-04-15 ENCOUNTER — Ambulatory Visit
Admission: RE | Admit: 2019-04-15 | Discharge: 2019-04-15 | Disposition: A | Discharge status: Home / Self Care | Payer: BC Managed Care – PPO | Source: Ambulatory Visit | Attending: Family Medicine | Admitting: Family Medicine

## 2019-04-15 ENCOUNTER — Other Ambulatory Visit: Payer: Self-pay

## 2019-04-15 DIAGNOSIS — Z1231 Encounter for screening mammogram for malignant neoplasm of breast: Secondary | ICD-10-CM | POA: Insufficient documentation

## 2019-06-10 ENCOUNTER — Encounter
Admission: RE | Admit: 2019-06-10 | Discharge: 2019-06-10 | Disposition: A | Discharge status: Home / Self Care | Payer: BC Managed Care – PPO | Source: Ambulatory Visit | Attending: Surgery | Admitting: Surgery

## 2019-06-10 ENCOUNTER — Other Ambulatory Visit: Payer: Self-pay

## 2019-06-10 DIAGNOSIS — I10 Essential (primary) hypertension: Secondary | ICD-10-CM | POA: Insufficient documentation

## 2019-06-10 DIAGNOSIS — Z01818 Encounter for other preprocedural examination: Secondary | ICD-10-CM | POA: Insufficient documentation

## 2019-06-10 LAB — CBC
HCT: 41.4 % (ref 36.0–46.0)
Hemoglobin: 13.5 g/dL (ref 12.0–15.0)
MCH: 28.7 pg (ref 26.0–34.0)
MCHC: 32.6 g/dL (ref 30.0–36.0)
MCV: 88.1 fL (ref 80.0–100.0)
Platelets: 305 10*3/uL (ref 150–400)
RBC: 4.7 MIL/uL (ref 3.87–5.11)
RDW: 13.3 % (ref 11.5–15.5)
WBC: 7.3 10*3/uL (ref 4.0–10.5)
nRBC: 0 % (ref 0.0–0.2)

## 2019-06-10 LAB — TYPE AND SCREEN
ABO/RH(D): A POS
Antibody Screen: NEGATIVE

## 2019-06-10 LAB — BASIC METABOLIC PANEL
Anion gap: 9 (ref 5–15)
BUN: 26 mg/dL — ABNORMAL HIGH (ref 8–23)
CO2: 27 mmol/L (ref 22–32)
Calcium: 9.7 mg/dL (ref 8.9–10.3)
Chloride: 103 mmol/L (ref 98–111)
Creatinine, Ser: 0.86 mg/dL (ref 0.44–1.00)
GFR calc Af Amer: >60 mL/min (ref >60)
GFR calc non Af Amer: >60 mL/min (ref >60)
Glucose, Bld: 96 mg/dL (ref 70–99)
Potassium: 4.1 mmol/L (ref 3.5–5.1)
Sodium: 139 mmol/L (ref 135–145)

## 2019-06-10 LAB — URINALYSIS, ROUTINE W REFLEX MICROSCOPIC
Bilirubin Urine: NEGATIVE
Glucose, UA: NEGATIVE mg/dL
Hgb urine dipstick: NEGATIVE
Ketones, ur: NEGATIVE mg/dL
Leukocytes,Ua: NEGATIVE
Nitrite: NEGATIVE
Protein, ur: NEGATIVE mg/dL
Specific Gravity, Urine: 1.023 (ref 1.005–1.030)
pH: 6 (ref 5.0–8.0)

## 2019-06-10 LAB — SURGICAL PCR SCREEN
MRSA, PCR: NEGATIVE
Staphylococcus aureus: NEGATIVE

## 2019-06-10 NOTE — Patient Instructions (Signed)
Your procedure is scheduled on: June 18, 2019 THURSDAY Report to Day Surgery on the 2nd floor of the [facility]. To find out your arrival time, please call [phone] between 1PM - 3PM on: Wednesday June 17, 2019  REMEMBER: Instructions that are not followed completely may result in serious medical risk, up to and including death; or upon the discretion of your surgeon and anesthesiologist your surgery may need to be rescheduled.  Do not eat food after midnight the night before surgery.  No gum chewing, lozengers or hard candies.  You may however, drink CLEAR liquids up to 2 hours before you are scheduled to arrive for your surgery. Do not drink anything within 2 hours of the start of your surgery.  Clear liquids include: - water  - apple juice without pulp - CLEAR gatorade - black coffee or tea (Do NOT add milk or creamers to the coffee or tea) Do NOT drink anything that is not on this list.  Type 1 and Type 2 diabetics should only drink water.  No Alcohol for 24 hours before or after surgery.  No Smoking including e-cigarettes for 24 hours prior to surgery.  No chewable tobacco products for at least 6 hours prior to surgery.  No nicotine patches on the day of surgery.  On the morning of surgery brush your teeth with toothpaste and water, you may rinse your mouth with mouthwash if you wish. Do not swallow any toothpaste or mouthwash.  Notify your doctor if there is any change in your medical condition (cold, fever, infection).  Do not wear jewelry, make-up, hairpins, clips or nail polish.  Do not wear lotions, powders, or perfumes.   Do not shave 48 hours prior to surgery.   Contacts and dentures may not be worn into surgery.  Do not bring valuables to the hospital, including drivers license, insurance or credit cards.  [facility] is not responsible for any belongings or valuables.   TAKE THESE MEDICATIONS THE MORNING OF SURGERY: XANAX IF  NEEDED LEXAPRO SYNTHROID   DO NOT TAKE LOSARTAN MORNING OF SURGERY  Use CHG Soap as directed on instruction sheet.  Follow recommendations from Cardiologist, Pulmonologist or PCP regarding stopping Aspirin  Stop Anti-inflammatories (NSAIDS) such as Advil, Aleve, Ibuprofen, Motrin, Naproxen, Naprosyn and Aspirin based products such as Excedrin, Goodys Powder, BC Powder. MELOXICAM  (May take Tylenol or Acetaminophen if needed.)  Stop ANY OVER THE COUNTER supplements until after surgery. STOP MOVE FREE (May continue Vitamin D, Vitamin B, and multivitamin.)  Wear comfortable clothing (specific to your surgery type) to the hospital.  Plan for stool softeners for home use.  [place].  If you are being discharged the day of surgery, you will not be allowed to drive home. You will need a responsible adult to drive you home and stay with you that night.   If you are taking public transportation, you will need to have a responsible adult with you. Please confirm with your physician that it is acceptable to use public transportation.   Please call [phone] if you have any questions about these instructions.

## 2019-06-11 LAB — URINE CULTURE: Culture: NO GROWTH

## 2019-06-15 ENCOUNTER — Other Ambulatory Visit
Admission: RE | Admit: 2019-06-15 | Discharge: 2019-06-15 | Disposition: A | Discharge status: Home / Self Care | Payer: BC Managed Care – PPO | Source: Ambulatory Visit | Attending: Surgery | Admitting: Surgery

## 2019-06-15 DIAGNOSIS — Z20828 Contact with and (suspected) exposure to other viral communicable diseases: Secondary | ICD-10-CM | POA: Diagnosis not present

## 2019-06-15 DIAGNOSIS — Z01818 Encounter for other preprocedural examination: Secondary | ICD-10-CM | POA: Insufficient documentation

## 2019-06-15 LAB — SARS CORONAVIRUS 2 (TAT 6-24 HRS): SARS Coronavirus 2: NEGATIVE

## 2019-06-17 MED ORDER — CLINDAMYCIN PHOSPHATE 900 MG/50ML IV SOLN
900.0000 mg | Freq: Once | INTRAVENOUS | Status: AC
  Administered 2019-06-18: 900 mg via INTRAVENOUS

## 2019-06-18 ENCOUNTER — Inpatient Hospital Stay: Payer: BC Managed Care – PPO | Admitting: Anesthesiology

## 2019-06-18 ENCOUNTER — Encounter
Admission: RE | Disposition: A | Discharge status: Home Health | Payer: Self-pay | Source: Home / Self Care | Attending: Surgery

## 2019-06-18 ENCOUNTER — Inpatient Hospital Stay: Payer: BC Managed Care – PPO

## 2019-06-18 ENCOUNTER — Inpatient Hospital Stay
Admission: RE | Admit: 2019-06-18 | Discharge: 2019-06-22 | DRG: 470 | Disposition: A | Discharge status: Home Health | Payer: BC Managed Care – PPO | Attending: Surgery | Admitting: Surgery

## 2019-06-18 ENCOUNTER — Other Ambulatory Visit: Payer: Self-pay

## 2019-06-18 DIAGNOSIS — Z882 Allergy status to sulfonamides status: Secondary | ICD-10-CM

## 2019-06-18 DIAGNOSIS — E039 Hypothyroidism, unspecified: Secondary | ICD-10-CM | POA: Diagnosis present

## 2019-06-18 DIAGNOSIS — M1711 Unilateral primary osteoarthritis, right knee: Secondary | ICD-10-CM | POA: Diagnosis present

## 2019-06-18 DIAGNOSIS — Z79890 Hormone replacement therapy (postmenopausal): Secondary | ICD-10-CM

## 2019-06-18 DIAGNOSIS — I10 Essential (primary) hypertension: Secondary | ICD-10-CM | POA: Diagnosis present

## 2019-06-18 DIAGNOSIS — Z880 Allergy status to penicillin: Secondary | ICD-10-CM | POA: Diagnosis not present

## 2019-06-18 DIAGNOSIS — Z96651 Presence of right artificial knee joint: Secondary | ICD-10-CM

## 2019-06-18 DIAGNOSIS — M7989 Other specified soft tissue disorders: Secondary | ICD-10-CM

## 2019-06-18 HISTORY — PX: TOTAL KNEE ARTHROPLASTY: SHX125

## 2019-06-18 LAB — ABO/RH: ABO/RH(D): A POS

## 2019-06-18 SURGERY — ARTHROPLASTY, KNEE, TOTAL
Anesthesia: Spinal | Site: Knee | Laterality: Right

## 2019-06-18 MED ORDER — HYDROMORPHONE HCL 1 MG/ML IJ SOLN: 0.2500 mg | INTRAMUSCULAR | Status: DC | PRN

## 2019-06-18 MED ORDER — BUTALBITAL-APAP-CAFFEINE 50-325-40 MG PO TABS
1.0000 | ORAL_TABLET | Freq: Two times a day (BID) | ORAL | Status: DC | PRN
  Filled 2019-06-18: qty 1

## 2019-06-18 MED ORDER — DOCUSATE SODIUM 100 MG PO CAPS
100.0000 mg | ORAL_CAPSULE | Freq: Two times a day (BID) | ORAL | Status: DC
  Administered 2019-06-18 – 2019-06-22 (×8): 100 mg via ORAL
  Filled 2019-06-18 (×8): qty 1

## 2019-06-18 MED ORDER — LEVOTHYROXINE SODIUM 112 MCG PO TABS
112.0000 ug | ORAL_TABLET | Freq: Every day | ORAL | Status: DC
  Administered 2019-06-19 – 2019-06-22 (×4): 112 ug via ORAL
  Filled 2019-06-18 (×4): qty 1

## 2019-06-18 MED ORDER — ACETAMINOPHEN 160 MG/5ML PO SOLN
325.0000 mg | ORAL | Status: DC | PRN
  Filled 2019-06-18: qty 20.3

## 2019-06-18 MED ORDER — MAGNESIUM OXIDE 400 (241.3 MG) MG PO TABS
200.0000 mg | ORAL_TABLET | Freq: Every day | ORAL | Status: DC
  Administered 2019-06-19 – 2019-06-21 (×3): 200 mg via ORAL
  Filled 2019-06-18 (×3): qty 1

## 2019-06-18 MED ORDER — SODIUM CHLORIDE 0.9 % IV SOLN
INTRAVENOUS | Status: DC | PRN
  Administered 2019-06-18: 60 mL

## 2019-06-18 MED ORDER — ACETAMINOPHEN 500 MG PO TABS
1000.0000 mg | ORAL_TABLET | Freq: Four times a day (QID) | ORAL | Status: AC
  Administered 2019-06-18 – 2019-06-19 (×4): 1000 mg via ORAL
  Filled 2019-06-18 (×4): qty 2

## 2019-06-18 MED ORDER — PROPOFOL 10 MG/ML IV BOLUS
INTRAVENOUS | Status: DC | PRN
  Administered 2019-06-18: 50 mg via INTRAVENOUS

## 2019-06-18 MED ORDER — SODIUM CHLORIDE (PF) 0.9 % IJ SOLN
INTRAMUSCULAR | Status: DC | PRN
  Administered 2019-06-18: 40 mL via INTRAVENOUS

## 2019-06-18 MED ORDER — ADULT MULTIVITAMIN W/MINERALS CH
1.0000 | ORAL_TABLET | Freq: Every day | ORAL | Status: DC
  Administered 2019-06-19 – 2019-06-21 (×3): 1 via ORAL
  Filled 2019-06-18 (×3): qty 1

## 2019-06-18 MED ORDER — SODIUM CHLORIDE 0.9 % IV SOLN
INTRAVENOUS | Status: DC
  Administered 2019-06-18: 75 mL/h via INTRAVENOUS

## 2019-06-18 MED ORDER — ZINC SULFATE 220 (50 ZN) MG PO CAPS
220.0000 mg | ORAL_CAPSULE | Freq: Every day | ORAL | Status: DC
  Administered 2019-06-19 – 2019-06-21 (×3): 220 mg via ORAL
  Filled 2019-06-18 (×5): qty 1

## 2019-06-18 MED ORDER — ONDANSETRON HCL 4 MG/2ML IJ SOLN: 4.0000 mg | Freq: Four times a day (QID) | INTRAMUSCULAR | Status: DC | PRN

## 2019-06-18 MED ORDER — ONDANSETRON HCL 4 MG/2ML IJ SOLN
INTRAMUSCULAR | Status: DC | PRN
  Administered 2019-06-18: 4 mg via INTRAVENOUS

## 2019-06-18 MED ORDER — ACETAMINOPHEN 325 MG PO TABS
325.0000 mg | ORAL_TABLET | Freq: Four times a day (QID) | ORAL | Status: DC | PRN
  Administered 2019-06-20 – 2019-06-21 (×3): 650 mg via ORAL
  Filled 2019-06-18 (×3): qty 2

## 2019-06-18 MED ORDER — ACETAMINOPHEN 325 MG PO TABS: 325.0000 mg | ORAL_TABLET | ORAL | Status: DC | PRN

## 2019-06-18 MED ORDER — ALPRAZOLAM 0.5 MG PO TABS
0.5000 mg | ORAL_TABLET | Freq: Two times a day (BID) | ORAL | Status: DC | PRN
  Administered 2019-06-20: 0.5 mg via ORAL
  Filled 2019-06-18: qty 1

## 2019-06-18 MED ORDER — PROPOFOL 500 MG/50ML IV EMUL
INTRAVENOUS | Status: DC | PRN
  Administered 2019-06-18: 80 ug/kg/min via INTRAVENOUS

## 2019-06-18 MED ORDER — FAMOTIDINE 20 MG PO TABS
ORAL_TABLET | ORAL | Status: AC
  Administered 2019-06-18: 20 mg via ORAL
  Filled 2019-06-18: qty 1

## 2019-06-18 MED ORDER — OXYCODONE HCL 5 MG PO TABS
5.0000 mg | ORAL_TABLET | ORAL | Status: DC | PRN
  Administered 2019-06-18: 5 mg via ORAL
  Administered 2019-06-19 (×4): 10 mg via ORAL
  Administered 2019-06-19: 5 mg via ORAL
  Administered 2019-06-20 – 2019-06-22 (×11): 10 mg via ORAL
  Filled 2019-06-18: qty 2
  Filled 2019-06-18: qty 1
  Filled 2019-06-18 (×12): qty 2
  Filled 2019-06-18: qty 1
  Filled 2019-06-18 (×4): qty 2

## 2019-06-18 MED ORDER — MIDAZOLAM HCL 5 MG/5ML IJ SOLN
INTRAMUSCULAR | Status: DC | PRN
  Administered 2019-06-18: 2 mg via INTRAVENOUS

## 2019-06-18 MED ORDER — ONDANSETRON HCL 4 MG PO TABS
4.0000 mg | ORAL_TABLET | Freq: Four times a day (QID) | ORAL | Status: DC | PRN
  Administered 2019-06-20: 4 mg via ORAL
  Filled 2019-06-18: qty 1

## 2019-06-18 MED ORDER — KETOROLAC TROMETHAMINE 15 MG/ML IJ SOLN
15.0000 mg | Freq: Four times a day (QID) | INTRAMUSCULAR | Status: AC
  Administered 2019-06-18 – 2019-06-19 (×4): 15 mg via INTRAVENOUS
  Filled 2019-06-18 (×4): qty 1

## 2019-06-18 MED ORDER — METOCLOPRAMIDE HCL 5 MG/ML IJ SOLN: 5.0000 mg | Freq: Three times a day (TID) | INTRAMUSCULAR | Status: DC | PRN

## 2019-06-18 MED ORDER — SODIUM CHLORIDE 0.9 % IV SOLN
INTRAVENOUS | Status: DC | PRN
  Administered 2019-06-18: 20 ug/min via INTRAVENOUS

## 2019-06-18 MED ORDER — ENOXAPARIN SODIUM 40 MG/0.4ML ~~LOC~~ SOLN
40.0000 mg | SUBCUTANEOUS | Status: DC
  Administered 2019-06-19 – 2019-06-22 (×4): 40 mg via SUBCUTANEOUS
  Filled 2019-06-18 (×4): qty 0.4

## 2019-06-18 MED ORDER — KETOROLAC TROMETHAMINE 30 MG/ML IJ SOLN
INTRAMUSCULAR | Status: AC
  Administered 2019-06-18: 30 mg via INTRAVENOUS
  Filled 2019-06-18: qty 1

## 2019-06-18 MED ORDER — TRANEXAMIC ACID 1000 MG/10ML IV SOLN
INTRAVENOUS | Status: DC | PRN
  Administered 2019-06-18: 1000 mg via TOPICAL

## 2019-06-18 MED ORDER — TRAMADOL HCL 50 MG PO TABS
50.0000 mg | ORAL_TABLET | Freq: Four times a day (QID) | ORAL | Status: DC | PRN
  Administered 2019-06-19 – 2019-06-22 (×8): 50 mg via ORAL
  Filled 2019-06-18 (×8): qty 1

## 2019-06-18 MED ORDER — METOCLOPRAMIDE HCL 10 MG PO TABS: 5.0000 mg | ORAL_TABLET | Freq: Three times a day (TID) | ORAL | Status: DC | PRN

## 2019-06-18 MED ORDER — FAMOTIDINE 20 MG PO TABS
20.0000 mg | ORAL_TABLET | Freq: Once | ORAL | Status: AC
  Administered 2019-06-18: 10:00:00 20 mg via ORAL

## 2019-06-18 MED ORDER — DIPHENHYDRAMINE HCL 12.5 MG/5ML PO ELIX: 12.5000 mg | ORAL_SOLUTION | ORAL | Status: DC | PRN

## 2019-06-18 MED ORDER — ESCITALOPRAM OXALATE 10 MG PO TABS
5.0000 mg | ORAL_TABLET | Freq: Every day | ORAL | Status: DC
  Administered 2019-06-19 – 2019-06-22 (×4): 5 mg via ORAL
  Filled 2019-06-18 (×5): qty 0.5

## 2019-06-18 MED ORDER — BUPIVACAINE HCL (PF) 0.5 % IJ SOLN
INTRAMUSCULAR | Status: DC | PRN
  Administered 2019-06-18: 3 mL

## 2019-06-18 MED ORDER — CLINDAMYCIN PHOSPHATE 900 MG/50ML IV SOLN
INTRAVENOUS | Status: AC
  Filled 2019-06-18: qty 50

## 2019-06-18 MED ORDER — PROMETHAZINE HCL 25 MG/ML IJ SOLN: 6.2500 mg | INTRAMUSCULAR | Status: DC | PRN

## 2019-06-18 MED ORDER — KETOROLAC TROMETHAMINE 30 MG/ML IJ SOLN
30.0000 mg | Freq: Once | INTRAMUSCULAR | Status: AC
  Administered 2019-06-18: 13:00:00 30 mg via INTRAVENOUS

## 2019-06-18 MED ORDER — MEPERIDINE HCL 50 MG/ML IJ SOLN: 6.2500 mg | INTRAMUSCULAR | Status: DC | PRN

## 2019-06-18 MED ORDER — LOSARTAN POTASSIUM 50 MG PO TABS
50.0000 mg | ORAL_TABLET | Freq: Every day | ORAL | Status: DC
  Administered 2019-06-19 – 2019-06-22 (×4): 50 mg via ORAL
  Filled 2019-06-18 (×4): qty 1

## 2019-06-18 MED ORDER — FENTANYL CITRATE (PF) 100 MCG/2ML IJ SOLN: 25.0000 ug | INTRAMUSCULAR | Status: DC | PRN

## 2019-06-18 MED ORDER — FLEET ENEMA 7-19 GM/118ML RE ENEM: 1.0000 | ENEMA | Freq: Once | RECTAL | Status: DC | PRN

## 2019-06-18 MED ORDER — CLINDAMYCIN PHOSPHATE 900 MG/50ML IV SOLN
900.0000 mg | Freq: Four times a day (QID) | INTRAVENOUS | Status: AC
  Administered 2019-06-18 – 2019-06-19 (×3): 900 mg via INTRAVENOUS
  Filled 2019-06-18 (×3): qty 50

## 2019-06-18 MED ORDER — LACTATED RINGERS IV SOLN
INTRAVENOUS | Status: DC
  Administered 2019-06-18: 10:00:00 via INTRAVENOUS

## 2019-06-18 MED ORDER — MIDAZOLAM HCL 2 MG/2ML IJ SOLN
INTRAMUSCULAR | Status: AC
  Filled 2019-06-18: qty 2

## 2019-06-18 MED ORDER — PROPOFOL 500 MG/50ML IV EMUL
INTRAVENOUS | Status: AC
  Filled 2019-06-18: qty 50

## 2019-06-18 MED ORDER — MAGNESIUM HYDROXIDE 400 MG/5ML PO SUSP
30.0000 mL | Freq: Every day | ORAL | Status: DC | PRN
  Administered 2019-06-21: 30 mL via ORAL
  Filled 2019-06-18: qty 30

## 2019-06-18 MED ORDER — BISACODYL 10 MG RE SUPP
10.0000 mg | Freq: Every day | RECTAL | Status: DC | PRN
  Administered 2019-06-22: 10 mg via RECTAL
  Filled 2019-06-18: qty 1

## 2019-06-18 MED ORDER — BUPIVACAINE-EPINEPHRINE (PF) 0.5% -1:200000 IJ SOLN
INTRAMUSCULAR | Status: DC | PRN
  Administered 2019-06-18: 30 mL via PERINEURAL

## 2019-06-18 SURGICAL SUPPLY — 62 items
BLADE SAW SAG 25X90X1.19 (BLADE) ×2 IMPLANT
BLADE SURG SZ20 CARB STEEL (BLADE) ×2 IMPLANT
BNDG ELASTIC 6X5.8 VLCR NS LF (GAUZE/BANDAGES/DRESSINGS) ×2 IMPLANT
CANISTER SUCT 1200ML W/VALVE (MISCELLANEOUS) ×2 IMPLANT
CANISTER SUCT 3000ML PPV (MISCELLANEOUS) ×2 IMPLANT
CEMENT BONE R 1X40 (Cement) ×4 IMPLANT
CEMENT VACUUM MIXING SYSTEM (MISCELLANEOUS) ×2 IMPLANT
CHLORAPREP W/TINT 26 (MISCELLANEOUS) ×2 IMPLANT
COOLER POLAR GLACIER W/PUMP (MISCELLANEOUS) ×2 IMPLANT
COVER MAYO STAND REUSABLE (DRAPES) ×2 IMPLANT
COVER WAND RF STERILE (DRAPES) ×2 IMPLANT
CUFF TOURN SGL QUICK 24 (TOURNIQUET CUFF)
CUFF TOURN SGL QUICK 30 (TOURNIQUET CUFF) ×1
CUFF TRNQT CYL 24X4X16.5-23 (TOURNIQUET CUFF) IMPLANT
CUFF TRNQT CYL 30X4X21-28X (TOURNIQUET CUFF) IMPLANT
DRAPE 3/4 80X56 (DRAPES) ×2 IMPLANT
DRAPE SPLIT 6X30 W/TAPE (DRAPES) ×2 IMPLANT
DRSG OPSITE POSTOP 4X10 (GAUZE/BANDAGES/DRESSINGS) ×2 IMPLANT
DRSG OPSITE POSTOP 4X8 (GAUZE/BANDAGES/DRESSINGS) ×2 IMPLANT
ELECT CAUTERY BLADE 6.4 (BLADE) ×2 IMPLANT
ELECT REM PT RETURN 9FT ADLT (ELECTROSURGICAL) ×2
ELECTRODE REM PT RTRN 9FT ADLT (ELECTROSURGICAL) ×1 IMPLANT
FEMORAL CR RIGHT 67.5MM (Joint) ×1 IMPLANT
GLOVE BIO SURGEON STRL SZ7.5 (GLOVE) ×8 IMPLANT
GLOVE BIO SURGEON STRL SZ8 (GLOVE) ×11 IMPLANT
GLOVE BIOGEL PI IND STRL 8 (GLOVE) ×1 IMPLANT
GLOVE BIOGEL PI INDICATOR 8 (GLOVE) ×1
GLOVE INDICATOR 8.0 STRL GRN (GLOVE) ×6 IMPLANT
GLOVE SURG SYN 6.5 ES PF (GLOVE) ×6 IMPLANT
GLOVE SURG SYN 6.5 PF PI (GLOVE) IMPLANT
GOWN STRL REUS W/ TWL LRG LVL3 (GOWN DISPOSABLE) ×1 IMPLANT
GOWN STRL REUS W/ TWL XL LVL3 (GOWN DISPOSABLE) ×1 IMPLANT
GOWN STRL REUS W/TWL LRG LVL3 (GOWN DISPOSABLE) ×3
GOWN STRL REUS W/TWL XL LVL3 (GOWN DISPOSABLE) ×1
HOLDER FOLEY CATH W/STRAP (MISCELLANEOUS) ×2 IMPLANT
HOOD PEEL AWAY FLYTE STAYCOOL (MISCELLANEOUS) ×7 IMPLANT
INSERT TIB BEARING 71 10 (Insert) ×1 IMPLANT
KIT TURNOVER KIT A (KITS) ×2 IMPLANT
NDL SAFETY ECLIPSE 18X1.5 (NEEDLE) ×2 IMPLANT
NDL SPNL 20GX3.5 QUINCKE YW (NEEDLE) ×1 IMPLANT
NEEDLE HYPO 18GX1.5 SHARP (NEEDLE) ×2
NEEDLE SPNL 20GX3.5 QUINCKE YW (NEEDLE) ×2 IMPLANT
NS IRRIG 1000ML POUR BTL (IV SOLUTION) ×2 IMPLANT
PACK TOTAL KNEE (MISCELLANEOUS) ×2 IMPLANT
PAD WRAPON POLAR KNEE (MISCELLANEOUS) ×1 IMPLANT
PATELLA STD 34X8.5 (Orthopedic Implant) ×1 IMPLANT
PLATE KNEE TIBIAL 71MM FIXED (Plate) ×1 IMPLANT
PULSAVAC PLUS IRRIG FAN TIP (DISPOSABLE) ×2
SOL .9 NS 3000ML IRR  AL (IV SOLUTION) ×1
SOL .9 NS 3000ML IRR UROMATIC (IV SOLUTION) ×1 IMPLANT
STAPLER SKIN PROX 35W (STAPLE) ×2 IMPLANT
SUCTION FRAZIER HANDLE 10FR (MISCELLANEOUS) ×1
SUCTION TUBE FRAZIER 10FR DISP (MISCELLANEOUS) ×1 IMPLANT
SUT VIC AB 0 CT1 36 (SUTURE) ×6 IMPLANT
SUT VIC AB 2-0 CT1 27 (SUTURE) ×3
SUT VIC AB 2-0 CT1 TAPERPNT 27 (SUTURE) ×3 IMPLANT
SYR 10ML LL (SYRINGE) ×2 IMPLANT
SYR 20ML LL LF (SYRINGE) ×2 IMPLANT
SYR 30ML LL (SYRINGE) ×6 IMPLANT
TIP FAN IRRIG PULSAVAC PLUS (DISPOSABLE) ×1 IMPLANT
TRAY FOLEY MTR SLVR 16FR STAT (SET/KITS/TRAYS/PACK) ×2 IMPLANT
WRAPON POLAR PAD KNEE (MISCELLANEOUS) ×2

## 2019-06-18 NOTE — H&P (Signed)
Paper H&P to be scanned into permanent record. H&P reviewed and patient re-examined. No changes. 

## 2019-06-18 NOTE — Anesthesia Procedure Notes (Signed)
Spinal  Patient location during procedure: OR Start time: 06/18/2019 10:30 AM End time: 06/18/2019 10:36 AM Staffing Resident/CRNA: Nelda Marseille, CRNA Performed: resident/CRNA  Preanesthetic Checklist Completed: patient identified, site marked, surgical consent, pre-op evaluation, timeout performed, IV checked, risks and benefits discussed and monitors and equipment checked Spinal Block Patient position: sitting Prep: Betadine Patient monitoring: heart rate, continuous pulse ox, blood pressure and cardiac monitor Approach: right paramedian Location: L4-5 Injection technique: single-shot Needle Needle type: Whitacre and Introducer  Needle gauge: 25 G Needle length: 9 cm Additional Notes Negative paresthesia. Negative blood return. Positive free-flowing CSF. Expiration date of kit checked and confirmed. Patient tolerated procedure well, without complications.

## 2019-06-18 NOTE — Transfer of Care (Signed)
Immediate Anesthesia Transfer of Care Note  Patient: Sabrina Christian  Procedure(s) Performed: TOTAL KNEE ARTHROPLASTY (Right Knee)  Patient Location: PACU  Anesthesia Type:Spinal  Level of Consciousness: awake and sedated  Airway & Oxygen Therapy: Patient Spontanous Breathing and Patient connected to face mask oxygen  Post-op Assessment: Report given to RN and Post -op Vital signs reviewed and stable  Post vital signs: Reviewed and stable  Last Vitals:  Vitals Value Taken Time  BP    Temp    Pulse 94 06/18/19 1251  Resp 12 06/18/19 1251  SpO2 100 % 06/18/19 1251  Vitals shown include unvalidated device data.  Last Pain:  Vitals:   06/18/19 0927  PainSc: 6          Complications: No apparent anesthesia complications

## 2019-06-18 NOTE — Anesthesia Preprocedure Evaluation (Signed)
Anesthesia Evaluation  Patient identified by MRN, date of birth, ID band Patient awake    Reviewed: Allergy & Precautions, H&P , NPO status , reviewed documented beta blocker date and time   History of Anesthesia Complications (+) PONV and Family history of anesthesia reaction  Airway Mallampati: II  TM Distance: >3 FB Neck ROM: full    Dental  (+) Chipped, Caps   Pulmonary    Pulmonary exam normal        Cardiovascular hypertension, Normal cardiovascular exam     Neuro/Psych  Headaches, Anxiety    GI/Hepatic GERD  Controlled,  Endo/Other  Hypothyroidism   Renal/GU      Musculoskeletal   Abdominal   Peds  Hematology  (+) Blood dyscrasia, anemia ,   Anesthesia Other Findings Past Medical History: No date: Anemia     Comment:  H/O No date: Anxiety No date: Complication of anesthesia No date: Family history of adverse reaction to anesthesia     Comment:  MOM-N/V No date: GERD (gastroesophageal reflux disease)     Comment:  OCC-TAKES DGL CHEWABLE No date: Headache No date: Hypertension No date: Hypothyroidism No date: PONV (postoperative nausea and vomiting)     Comment:  DID NOT GET SICK WITH BACK SURGERY  Past Surgical History: No date: ABDOMINAL HYSTERECTOMY 2014: BACK SURGERY     Comment:  LAMINECTOMY No date: CARPAL TUNNEL RELEASE; Right 07/30/2017: SHOULDER ARTHROSCOPY WITH ROTATOR CUFF REPAIR; Right     Comment:  Procedure: SHOULDER ARTHROSCOPY WITH DEBRIDEMENT,               DECOMPRESSION, REPAIR OF THE ROTATOR CUFF TEAR WITH A               REGENERATION PATCH AND BICEPS TENODESIS;  Surgeon: Corky Mull, MD;  Location: [facility] ORS;  Service: Orthopedics;                Laterality: Right;     Reproductive/Obstetrics                             Anesthesia Physical Anesthesia Plan  ASA: II  Anesthesia Plan: Spinal   Post-op Pain Management:     Induction: Intravenous  PONV Risk Score and Plan: Treatment may vary due to age or medical condition and TIVA  Airway Management Planned: Nasal Cannula and Natural Airway  Additional Equipment:   Intra-op Plan:   Post-operative Plan:   Informed Consent: I have reviewed the patients History and Physical, chart, labs and discussed the procedure including the risks, benefits and alternatives for the proposed anesthesia with the patient or authorized representative who has indicated his/her understanding and acceptance.     Dental Advisory Given  Plan Discussed with: CRNA  Anesthesia Plan Comments:         Anesthesia Quick Evaluation

## 2019-06-18 NOTE — Anesthesia Procedure Notes (Signed)
Date/Time: 06/18/2019 11:13 AM Performed by: Nelda Marseille, CRNA Pre-anesthesia Checklist: Patient identified, Emergency Drugs available, Suction available, Patient being monitored and Timeout performed Oxygen Delivery Method: Simple face mask

## 2019-06-18 NOTE — Op Note (Signed)
06/18/2019  12:50 PM  Patient:   Sabrina Christian  Pre-Op Diagnosis:   Degenerative joint disease, right knee.  Post-Op Diagnosis:   Same  Procedure:   Right TKA using all-cemented Bi[employer] Vanguard system with a 67.5 mm mm PCR femur, a 71 mm tibial tray with a 10 mm anterior stabilized E-poly insert, and a 34 x 8.5 mm all-poly 3-pegged domed patella.  Surgeon:   Pascal Lux, MD  Assistant:   Cameron Proud, PA-C; Orland Penman, PA-S  Anesthesia:   Spinal  Findings:   As above  Complications:   None  EBL:   5 cc  Fluids:   1200 cc crystalloid  UOP:   250 cc  TT:   87 minutes at 300 mmHg  Drains:   None  Closure:   Staples  Implants:   As above  Brief Clinical Note:   The patient is a 68 year old female with a long history of progressively worsening right knee pain. The patient's symptoms have progressed despite medications, activity modification, injections, etc. The patient's history and examination were consistent with advanced degenerative joint disease of the right knee confirmed by plain radiographs. The patient presents at this time for a right total knee arthroplasty.  Procedure:   The patient was brought into the operating room. After adequate spinal anesthesia was obtained, the patient was lain in the supine position. A Foley catheter was placed by the nurse before the right lower extremity was prepped with ChloraPrep solution and draped sterilely. Preoperative antibiotics were administered. After verifying the proper laterality with a surgical timeout, the limb was exsanguinated with an Esmarch and the tourniquet inflated to 300 mmHg. A standard anterior approach to the knee was made through an approximately 7 inch incision. The incision was carried down through the subcutaneous tissues to expose superficial retinaculum. This was split the length of the incision and the medial flap elevated sufficiently to expose the medial retinaculum. The medial retinaculum was incised,  leaving a 3-4 mm cuff of tissue on the patella. This was extended distally along the medial border of the patellar tendon and proximally through the medial third of the quadriceps tendon. A subtotal fat pad excision was performed before the soft tissues were elevated off the anteromedial and anterolateral aspects of the proximal tibia to the level of the collateral ligaments. The anterior portions of the medial and lateral menisci were removed, as was the anterior cruciate ligament. With the knee flexed to 90, the external tibial guide was positioned and the appropriate proximal tibial cut made. This piece was taken to the back table where it was measured and found to be optimally replicated by a 71 mm component.  Attention was directed to the distal femur. The intramedullary canal was accessed through a 3/8" drill hole. The intramedullary guide was inserted and positioned in order to obtain a neutral flexion gap. The intercondylar block was positioned with care taken to avoid notching the anterior cortex of the femur. The appropriate cut was made. Next, the distal cutting block was placed at 6 of valgus alignment. Using the 9 mm slot, the distal cut was made. The distal femur was measured and found to be optimally replicated by the XX123456 mm component. The 67.5 mm 4-in-1 cutting block was positioned and first the posterior, then the posterior chamfer, the anterior chamfer, and finally the anterior cuts were made. At this point, the posterior portions medial and lateral menisci were removed. A trial reduction was performed using the appropriate femoral and  tibial components with the 10 mm insert. This demonstrated excellent stability to varus and valgus stressing both in flexion and extension while permitting full extension. Patella tracking was assessed and found to be excellent. Therefore, the tibial guide position was marked on the proximal tibia. The patella thickness was measured and found to be 18 mm.  Therefore, the appropriate cut was made. The patellar surface was measured and found to be optimally replicated by the 34 mm component. The three peg holes were drilled in place before the trial button was inserted. Patella tracking was assessed and found to be excellent, passing the "no thumb test". The lug holes were drilled into the distal femur before the trial component was removed, leaving only the tibial tray. The keel was then created using the appropriate tower, reamer, and punch.  The bony surfaces were prepared for cementing by irrigating them thoroughly with bacitracin saline solution via the jet lavage system. A bone plug was fashioned from some of the bone that had been removed previously and used to plug the distal femoral canal. In addition, 20 cc of Exparel diluted out to 60 cc with normal saline and 30 cc of 0.5% Sensorcaine were injected into the postero-medial and postero-lateral aspects of the knee, the medial and lateral gutter regions, and the peri-incisional tissues to help with postoperative analgesia. Meanwhile, the cement was being mixed on the back table. When it was ready, the tibial tray was cemented in first. The excess cement was removed using [employer] fast streamer. Next, the femoral component was impacted into place. Again, the excess cement was removed using [employer] fast streamer. The 10 mm trial insert was positioned and the knee brought into extension while the cement hardened. Finally, the patella was cemented into place and secured using the patellar clamp. Again, the excess cement was removed using [employer] fast streamer. Once the cement had hardened, the knee was placed through a range of motion with the findings as described above. Therefore, the trial insert was removed and, after verifying that no cement had been retained posteriorly, the permanent 10 mm anterior stabilized E-polyethylene insert was positioned and secured using the appropriate key locking mechanism. Again the knee was  placed through a range of motion with the findings as described above.  The wound was copiously irrigated with bacitracin saline solution using the jet lavage system before the quadriceps tendon and retinacular layer were reapproximated using #0 Vicryl interrupted sutures. The superficial retinacular layer also was closed using a running #0 Vicryl suture. A total of 10 cc of transexemic acid (TXA) was injected intra-articularly before the subcutaneous tissues were closed in several layers using 2-0 Vicryl interrupted sutures. The skin was closed using staples. A sterile honeycomb dressing was applied to the skin before the leg was wrapped with an Ace wrap to accommodate the Polar Care device. The patient was then awakened and returned to the recovery room in satisfactory condition after tolerating the procedure well.

## 2019-06-18 NOTE — Anesthesia Post-op Follow-up Note (Signed)
Anesthesia QCDR form completed.        

## 2019-06-18 NOTE — Progress Notes (Signed)
PT Cancellation Note  Patient Details Name: Sabrina Christian MRN: [id number] DOB: 1950-12-31   Cancelled Treatment:    Reason Eval/Treat Not Completed: Patient not medically ready. Pt has not yet had sensation return following surgery. Pt unable to perform ankle pump. Pt educated on role of PT during acute hospitalization and plan for following up tomorrow morning.   Zachary George PT, DPT 5:16 PM,06/18/19 [phone]

## 2019-06-19 ENCOUNTER — Encounter: Payer: Self-pay | Admitting: Surgery

## 2019-06-19 LAB — CBC WITH DIFFERENTIAL/PLATELET
Abs Immature Granulocytes: 0.03 10*3/uL (ref 0.00–0.07)
Basophils Absolute: 0 10*3/uL (ref 0.0–0.1)
Basophils Relative: 0 %
Eosinophils Absolute: 0.6 10*3/uL — ABNORMAL HIGH (ref 0.0–0.5)
Eosinophils Relative: 7 %
HCT: 31.6 % — ABNORMAL LOW (ref 36.0–46.0)
Hemoglobin: 10.1 g/dL — ABNORMAL LOW (ref 12.0–15.0)
Immature Granulocytes: 0 %
Lymphocytes Relative: 18 %
Lymphs Abs: 1.4 10*3/uL (ref 0.7–4.0)
MCH: 28.4 pg (ref 26.0–34.0)
MCHC: 32 g/dL (ref 30.0–36.0)
MCV: 88.8 fL (ref 80.0–100.0)
Monocytes Absolute: 0.8 10*3/uL (ref 0.1–1.0)
Monocytes Relative: 10 %
Neutro Abs: 5.1 10*3/uL (ref 1.7–7.7)
Neutrophils Relative %: 65 %
Platelets: 250 10*3/uL (ref 150–400)
RBC: 3.56 MIL/uL — ABNORMAL LOW (ref 3.87–5.11)
RDW: 13.4 % (ref 11.5–15.5)
WBC: 8 10*3/uL (ref 4.0–10.5)
nRBC: 0 % (ref 0.0–0.2)

## 2019-06-19 LAB — BASIC METABOLIC PANEL
Anion gap: 7 (ref 5–15)
BUN: 26 mg/dL — ABNORMAL HIGH (ref 8–23)
CO2: 23 mmol/L (ref 22–32)
Calcium: 8.3 mg/dL — ABNORMAL LOW (ref 8.9–10.3)
Chloride: 105 mmol/L (ref 98–111)
Creatinine, Ser: 1.17 mg/dL — ABNORMAL HIGH (ref 0.44–1.00)
GFR calc Af Amer: 56 mL/min — ABNORMAL LOW (ref >60)
GFR calc non Af Amer: 48 mL/min — ABNORMAL LOW (ref >60)
Glucose, Bld: 104 mg/dL — ABNORMAL HIGH (ref 70–99)
Potassium: 4 mmol/L (ref 3.5–5.1)
Sodium: 135 mmol/L (ref 135–145)

## 2019-06-19 MED ORDER — TRAMADOL HCL 50 MG PO TABS: 50.0000 mg | ORAL_TABLET | Freq: Four times a day (QID) | ORAL | 0 refills | Status: DC | PRN

## 2019-06-19 MED ORDER — ENOXAPARIN SODIUM 40 MG/0.4ML ~~LOC~~ SOLN: 40.0000 mg | SUBCUTANEOUS | 0 refills | Status: DC

## 2019-06-19 MED ORDER — OXYCODONE HCL 5 MG PO TABS: 5.0000 mg | ORAL_TABLET | ORAL | 0 refills | Status: DC | PRN

## 2019-06-19 NOTE — Progress Notes (Signed)
Physical Therapy Treatment Patient Details Name: Sabrina Christian MRN: [id number] DOB: 1951/01/29 Today's Date: 06/19/2019    History of Present Illness 68 yo female admitted s/p R TKR 06/18/19    PT Comments    Pt in recliner upon arrival and agreed to participate with PT. Pt reported feeling better after getting sick in morning eval. Pt performed therex with min assist secondary to pain and min cuing for correct performance. Pt rated pain at 6-7/10 throughout session. Pt is progressing slowly. Pt ambulated to hallway with antalgic gait pattern requiring min guard assist and encouragement. Pt reported feeling nauseous again after ambulation when sitting EOB however did not vomit. Pt required light min assist for LE advancement to get back in bed however pt mod Ind for scooting up in bed requiring vc for technique and increased effort. Pt will benefit from continued skilled therapy to improve deficits and allow for increased independence. PT will progress mobility as able including stair training.   Follow Up Recommendations  Home health PT     Equipment Recommendations  3in1 (PT)    Recommendations for Other Services       Precautions / Restrictions Precautions Precautions: Fall Restrictions Weight Bearing Restrictions: Yes RLE Weight Bearing: Weight bearing as tolerated    Mobility  Bed Mobility Overal bed mobility: Needs Assistance             General bed mobility comments: deferred, in recliner  Transfers Overall transfer level: Needs assistance Equipment used: Rolling walker (2 wheeled) Transfers: Sit to/from Stand Sit to Stand: Min guard         General transfer comment: good carryover with foot/hand placement, no physical assist required, increased effort to stand, steady upon initial standing  Ambulation/Gait Ambulation/Gait assistance: Min guard Gait Distance (Feet): 20 Feet Assistive device: Rolling walker (2 wheeled) Gait Pattern/deviations: Step-to  pattern;Decreased weight shift to right;Decreased stance time - right;Decreased stride length;Trunk flexed;Antalgic Gait velocity: decreased   General Gait Details: pt ambulated out to hallway and turned around right outside door, very antalgic and slow gait speed, pt ambulates with step to pattern but did have a few reciprocal steps, heavy reliance on UE support   Stairs             Wheelchair Mobility    Modified Rankin (Stroke Patients Only)       Balance Overall balance assessment: Needs assistance Sitting-balance support: No upper extremity supported;Feet supported Sitting balance-Leahy Scale: Good Sitting balance - Comments: steady sitting for LE therex   Standing balance support: Bilateral upper extremity supported Standing balance-Leahy Scale: Fair Standing balance comment: reliant on UE support from RW consistent with recent surgery and WBing status                            Cognition Arousal/Alertness: Awake/alert Behavior During Therapy: WFL for tasks assessed/performed Overall Cognitive Status: Within Functional Limits for tasks assessed                                        Exercises Total Joint Exercises Ankle Circles/Pumps: AROM;Both;10 reps Quad Sets: AROM;Right;10 reps Straight Leg Raises: AAROM;Right;10 reps Long Arc Quad: AROM;Right;10 reps Knee Flexion: AROM;Right;10 reps Other Exercises Other Exercises:     General Comments        Pertinent Vitals/Pain Pain Assessment: 0-10 Pain Score: 6  Pain Location: R knee  Pain Descriptors / Indicators: Hervey Ard;Aching;Sore;Operative site guarding Pain Intervention(s): Limited activity within patient's tolerance;Monitored during session;Premedicated before session;Repositioned;Ice applied    Home Living Family/patient expects to be discharged to:: Private residence Living Arrangements: Alone Available Help at Discharge: Friend(s);Available 24 hours/day(friend staying  with her for the first few days) Type of Home: House Home Access: Stairs to enter Entrance Stairs-Rails: Can reach both Home Layout: Two level;Bed/bath upstairs(pt has created a bedroom on main level for temporary use, half bath on main level) Home Equipment: Walker - 2 wheels;Shower seat - built Investment banker, operational      Prior Function Level of Independence: Independent      Comments: pt reports being independent with all ADLs/IADLs, 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, reports one fall in last year at the bottom of the stairs stating she missed the last step   PT Goals (current goals can now be found in the care plan section) Acute Rehab PT Goals Patient Stated Goal: return home and to daily activities Progress towards PT goals: Progressing toward goals    Frequency           PT Plan Current plan remains appropriate    Co-evaluation              AM-PAC PT "6 Clicks" Mobility   Outcome Measure  Help needed turning from your back to your side while in a flat bed without using bedrails?: A Little Help needed moving from lying on your back to sitting on the side of a flat bed without using bedrails?: A Little Help needed moving to and from a bed to a chair (including a wheelchair)?: A Little Help needed standing up from a chair using your arms (e.g., wheelchair or bedside chair)?: A Little Help needed to walk in hospital room?: A Little Help needed climbing 3-5 steps with a railing? : A Lot 6 Click Score: 17    End of Session Equipment Utilized During Treatment: Gait belt Activity Tolerance: Patient limited by pain Patient left: in bed;with call bell/phone within reach;with SCD's reapplied Nurse Communication: Mobility status PT Visit Diagnosis: Muscle weakness (generalized) (M62.81);Difficulty in walking, not elsewhere classified (R26.2);Other abnormalities of gait and mobility (R26.89)     Time: 1437-1500 PT Time  Calculation (min) (ACUTE ONLY): 23 min  Charges:  $Therapeutic Exercise: 8-22 mins                    Niana Martorana PT, DPT 4:25 PM,06/19/19 [phone]

## 2019-06-19 NOTE — TOC Benefit Eligibility Note (Signed)
Transition of Care [facility]) Benefit Eligibility Note    Patient Details  Name: Sabrina Christian MRN: [id number] Date of Birth: May 29, 1951   Medication/Dose: Enoxaparin 40mg  once daily for 14 days  Covered?: Yes  Prescription Coverage Preferred Pharmacy: [facility]  Spoke with Person/Company/Phone Number:: Seth Bake with [employer] at [phone]  Co-Pay: $10.00 estimated copay  Prior Approval: No  Deductible: (No deductibe.  $4,000 out of pocket max unmet as of time of call.)   Dannette Barbara Phone Number: [phone] or [phone] 06/19/2019, 12:58 PM

## 2019-06-19 NOTE — Evaluation (Signed)
Physical Therapy Evaluation Patient Details Name: Sabrina Christian MRN: [id number] DOB: [birth year] Today's Date: 06/19/2019   History of Present Illness  68 yo female admitted s/p R TKR 06/18/19  Clinical Impression  Pt 68 yo female admitted s/p R TKR. Pt in bed upon arrival and agreed to participate with PT. Pt presents with decreased strength, ROM, balance and activity tolerance consistent with recent surgery. Pt educated on HEP and performed therex requiring min cuing for correct performance. Pt required min physical assist with SLR and hip ABD secondary to pain. Pt required min guard assist for functional mobility throughout session. Pt reported nausea and feeling hot upon sitting EOB and did vomit x1. Nursing notified. Pt felt better and able to ambulate to recliner in room. Pt ambulated with very heavy reliance on UE support from walker with antalgic gait pattern. Pt rated pain 5/10- 7/10 throughout session. Pt would benefit from skilled acute therapy to improve deficits and increase independence as she does live alone. Pt would benefit from home health PT following hospital discharge in order to further improve deficits and return to PLOF.     Follow Up Recommendations Home health PT    Equipment Recommendations  3in1 (PT)(pt already has RW)    Recommendations for Other Services       Precautions / Restrictions Precautions Precautions: Fall Restrictions Weight Bearing Restrictions: Yes RLE Weight Bearing: Weight bearing as tolerated      Mobility  Bed Mobility Overal bed mobility: Needs Assistance Bed Mobility: Supine to Sit     Supine to sit: Min guard;HOB elevated     General bed mobility comments: no physical assist required, pt utilized increased time and effort to get EOB  Transfers Overall transfer level: Needs assistance Equipment used: Rolling walker (2 wheeled) Transfers: Sit to/from Stand Sit to Stand: Min assist;From elevated surface         General  transfer comment: pt educated on foot/hand position and using RW, pt required light min assist to rise and bed elevated, pt steady upon initial standing  Ambulation/Gait Ambulation/Gait assistance: Min guard Gait Distance (Feet): 15 Feet Assistive device: Rolling walker (2 wheeled) Gait Pattern/deviations: Step-to pattern;Decreased weight shift to right;Decreased stance time - right;Decreased stride length;Trunk flexed;Antalgic Gait velocity: decreased   General Gait Details: pt ambulated in room to recliner, very antalgic gait pattern with heavy reliance on UE support from RW, pt ambulated with step to pattern taking very small forward steps  Stairs            Wheelchair Mobility    Modified Rankin (Stroke Patients Only)       Balance Overall balance assessment: Needs assistance   Sitting balance-Leahy Scale: Good Sitting balance - Comments: steady sitting EOB     Standing balance-Leahy Scale: Fair Standing balance comment: reliant on UE support from RW consistent with recent surgery and WBing status                             Pertinent Vitals/Pain Pain Assessment: 0-10 Pain Score: 5  Pain Location: R knee Pain Descriptors / Indicators: Sharp;Aching;Sore;Operative site guarding Pain Intervention(s): Limited activity within patient's tolerance;Monitored during session;Repositioned;Ice applied    Home Living Family/patient expects to be discharged to:: Private residence Living Arrangements: Alone Available Help at Discharge: Friend(s);Available 24 hours/day(friend staying with her for the first few days) Type of Home: House Home Access: Stairs to enter Entrance Stairs-Rails: Can reach both Entrance Stairs-Number of Steps: 3  Home Layout: Two level;Bed/bath upstairs(pt has created a bedroom on main level for temporary use, half bath on main level) Home Equipment: Walker - 2 wheels      Prior Function Level of Independence: Independent          Comments: pt reports being independent with all ADLs/IADLs, 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, reports one fall in last year at the bottom of the stairs stating she missed the last step     Hand Dominance        Extremity/Trunk Assessment   Upper Extremity Assessment Upper Extremity Assessment: Overall WFL for tasks assessed;Defer to OT evaluation    Lower Extremity Assessment Lower Extremity Assessment: RLE deficits/detail RLE Deficits / Details: decreased strength, ROM, balance consistent with recent surgery    Cervical / Trunk Assessment Cervical / Trunk Assessment: Normal  Communication   Communication: No difficulties  Cognition Arousal/Alertness: Awake/alert Behavior During Therapy: WFL for tasks assessed/performed Overall Cognitive Status: Within Functional Limits for tasks assessed                                        General Comments      Exercises Total Joint Exercises Ankle Circles/Pumps: AROM;Both;10 reps Quad Sets: AROM;Right;10 reps Hip ABduction/ADduction: AAROM;Right;10 reps Straight Leg Raises: AAROM;Right;10 reps Long Arc Quad: AROM;Right;10 reps Knee Flexion: AROM;Right;10 reps Goniometric ROM: R knee ROM -7 deg extension - 68 deg flexion   Assessment/Plan    PT Assessment Patient needs continued PT services  PT Problem List Decreased strength;Decreased mobility;Decreased range of motion;Decreased activity tolerance;Decreased knowledge of use of DME;Decreased balance;Pain       PT Treatment Interventions DME instruction;Therapeutic exercise;Gait training;Stair training;Functional mobility training;Therapeutic activities;Patient/family education;Neuromuscular re-education;Balance training    PT Goals (Current goals can be found in the Care Plan section)  Acute Rehab PT Goals Patient Stated Goal: return home and to daily activities PT Goal Formulation: With patient Time For Goal  Achievement: 07/03/19 Potential to Achieve Goals: Good    Frequency BID   Barriers to discharge Inaccessible home environment;Decreased caregiver support      Co-evaluation               AM-PAC PT "6 Clicks" Mobility  Outcome Measure Help needed turning from your back to your side while in a flat bed without using bedrails?: A Little Help needed moving from lying on your back to sitting on the side of a flat bed without using bedrails?: A Little Help needed moving to and from a bed to a chair (including a wheelchair)?: A Little Help needed standing up from a chair using your arms (e.g., wheelchair or bedside chair)?: A Little Help needed to walk in hospital room?: A Little Help needed climbing 3-5 steps with a railing? : A Little 6 Click Score: 18    End of Session Equipment Utilized During Treatment: Gait belt Activity Tolerance: Patient limited by pain;Other (comment)(nausea) Patient left: in chair;with call bell/phone within reach;with SCD's reapplied Nurse Communication: Mobility status PT Visit Diagnosis: Muscle weakness (generalized) (M62.81);Difficulty in walking, not elsewhere classified (R26.2);Other abnormalities of gait and mobility (R26.89)    Time: [id number] PT Time Calculation (min) (ACUTE ONLY): 55 min   Charges:   PT Evaluation $PT Eval Low Complexity: 1 Low PT Treatments $Therapeutic Exercise: 8-22 mins       Nicklous Aburto PT, DPT 12:01 PM,06/19/19  [phone]   

## 2019-06-19 NOTE — Progress Notes (Signed)
  Subjective: 1 Day Post-Op Procedure(s) (LRB): TOTAL KNEE ARTHROPLASTY (Right) Patient reports pain as moderate.   Patient is well, and has had no acute complaints or problems Plan is to go Home after hospital stay. Negative for chest pain and shortness of breath Fever: no Gastrointestinal:Negative for nausea and vomiting  Objective: Vital signs in last 24 hours: Temp:  [96.9 F (36.1 C)-98.4 F (36.9 C)] 98.4 F (36.9 C) (10/09 1203) Pulse Rate:  [69-88] 71 (10/09 1203) Resp:  [11-18] 16 (10/09 0427) BP: (105-137)/(62-81) 137/80 (10/09 1203) SpO2:  [96 %-100 %] 100 % (10/09 1203)  Intake/Output from previous day:  Intake/Output Summary (Last 24 hours) at 06/19/2019 1316 Last data filed at 06/19/2019 0900 Gross per 24 hour  Intake 855 ml  Output 500 ml  Net 355 ml    Intake/Output this shift: Total I/O In: 240 [P.O.:240] Out: -   Labs: Recent Labs    06/19/19 0518  HGB 10.1*   Recent Labs    06/19/19 0518  WBC 8.0  RBC 3.56*  HCT 31.6*  PLT 250   Recent Labs    06/19/19 0518  NA 135  K 4.0  CL 105  CO2 23  BUN 26*  CREATININE 1.17*  GLUCOSE 104*  CALCIUM 8.3*   No results for input(s): LABPT, INR in the last 72 hours.   EXAM General - Patient is Alert, Appropriate and Oriented Extremity - ABD soft Neurovascular intact Sensation intact distally Intact pulses distally Dorsiflexion/Plantar flexion intact Incision: dressing C/D/I No cellulitis present Dressing/Incision - clean, dry, no drainage Motor Function - intact, moving foot and toes well on exam.   Past Medical History:  Diagnosis Date  . Anemia    H/O  . Anxiety   . Complication of anesthesia   . Family history of adverse reaction to anesthesia    MOM-N/V  . GERD (gastroesophageal reflux disease)    OCC-TAKES DGL CHEWABLE  . Headache   . Hypertension   . Hypothyroidism   . PONV (postoperative nausea and vomiting)    DID NOT GET SICK WITH BACK SURGERY     Assessment/Plan: 1 Day Post-Op Procedure(s) (LRB): TOTAL KNEE ARTHROPLASTY (Right) Active Problems:   Status post total knee replacement using cement, right  Estimated body mass index is 29.32 kg/m as calculated from the following:   Height as of 06/10/19: 5\' 8"  (1.727 m).   Weight as of 06/10/19: 87.5 kg. Advance diet Up with therapy D/C IV fluids when tolerating po intake.  Labs reviewed this AM, Hg 10.1, continue to monitor.  CBC ordered for tomorrow morning. Up with therapy today. Begin working on BM. Plan for possible d/c home tomorrow pending progress with PT.  DVT Prophylaxis - Lovenox, Foot Pumps and TED hose Weight-Bearing as tolerated to right leg  J. Cameron Proud, PA-C [facility] Orthopaedic Surgery 06/19/2019, 1:16 PM

## 2019-06-19 NOTE — Progress Notes (Signed)
Per PT when patient got up she vomited x 1 , patient declined PRN antinausea medication and states she felt hot during xfer and felt better since she was up in recliner chair . Will continue to monitor

## 2019-06-19 NOTE — Anesthesia Postprocedure Evaluation (Signed)
Anesthesia Post Note  Patient: Sabrina Christian  Procedure(s) Performed: TOTAL KNEE ARTHROPLASTY (Right Knee)  Patient location during evaluation: Nursing Unit Anesthesia Type: Spinal Level of consciousness: awake, awake and alert and oriented Pain management: pain level controlled Vital Signs Assessment: post-procedure vital signs reviewed and stable Respiratory status: spontaneous breathing, nonlabored ventilation and respiratory function stable Cardiovascular status: blood pressure returned to baseline and stable Postop Assessment: no headache and no backache Anesthetic complications: no     Last Vitals:  Vitals:   06/18/19 2330 06/19/19 0427  BP: 125/64 128/78  Pulse: 69 70  Resp: 16 16  Temp: 36.4 C 36.9 C  SpO2: 100% 96%    Last Pain:  Vitals:   06/18/19 2330  TempSrc: Oral  PainSc:                  [name]

## 2019-06-19 NOTE — Evaluation (Signed)
Occupational Therapy Evaluation Patient Details Name: Sabrina Christian MRN: [id number] DOB: [birth year] Today's Date: 06/19/2019    History of Present Illness 68 yo female admitted s/p R TKR 06/18/19   Clinical Impression   Pt seen for OT evaluation this date, POD#1 from above surgery. Pt was independent in all ADLs prior to surgery and is eager to return to PLOF with less pain and improved safety and independence. Pt currently requires minimal assist for LB dressing while in seated position due to pain and limited AROM of R knee. Pt instructed in polar care mgt, falls prevention strategies, pet care considerations, functional t/f training/RW mgt, home/routines modifications, DME/AE for LB bathing and dressing tasks, and compression stocking mgt. Handout provided to support recall and carryover. Pt verbalized understanding and would benefit from skilled OT services including additional instruction in dressing techniques with or without assistive devices for dressing and bathing skills to support recall and carryover prior to discharge and ultimately to maximize safety, independence, and minimize falls risk and caregiver burden. Do not currently anticipate any OT needs following this hospitalization.     Follow Up Recommendations  No OT follow up    Equipment Recommendations  3 in 1 bedside commode    Recommendations for Other Services       Precautions / Restrictions Precautions Precautions: Fall Restrictions Weight Bearing Restrictions: Yes RLE Weight Bearing: Weight bearing as tolerated      Mobility Bed Mobility     General bed mobility comments: deferred, in recliner  Transfers Overall transfer level: Needs assistance Equipment used: Rolling walker (2 wheeled) Transfers: Sit to/from Stand Sit to Stand: Min guard;Min assist         General transfer comment: pt educated on foot/hand position and using RW, pt required light min assist to rise and bed elevated, pt steady upon  initial standing    Balance Overall balance assessment: Needs assistance Sitting-balance support: No upper extremity supported;Feet supported Sitting balance-Leahy Scale: Good Sitting balance - Comments: steady sitting EOB   Standing balance support: Bilateral upper extremity supported Standing balance-Leahy Scale: Fair Standing balance comment: reliant on UE support from RW consistent with recent surgery and WBing status                           ADL either performed or assessed with clinical judgement   ADL Overall ADL's : Needs assistance/impaired                                       General ADL Comments: Min A for LB ADL, Max A for compression stocking mgt; CGA for functional ADL transfers; pt reports plan for family/friends to assist     Vision Patient Visual Report: No change from baseline       Perception     Praxis      Pertinent Vitals/Pain Pain Assessment: 0-10 Pain Score: 6  Pain Location: R knee Pain Descriptors / Indicators: Sharp;Aching;Sore;Operative site guarding Pain Intervention(s): Limited activity within patient's tolerance;Monitored during session;Repositioned;Ice applied     Hand Dominance Right   Extremity/Trunk Assessment Upper Extremity Assessment Upper Extremity Assessment: Overall WFL for tasks assessed   Lower Extremity Assessment Lower Extremity Assessment: Defer to PT evaluation;RLE deficits/detail RLE Deficits / Details: decreased strength, ROM, balance consistent with recent surgery   Cervical / Trunk Assessment Cervical / Trunk Assessment: Normal   Communication Communication  Communication: No difficulties   Cognition Arousal/Alertness: Awake/alert Behavior During Therapy: WFL for tasks assessed/performed Overall Cognitive Status: Within Functional Limits for tasks assessed                                     General Comments       Exercises Other Exercises Other Exercises: Pt  instructed in falls prevention strategies, pet care considerations, AE/DME for ADL, functional t/f training/RW mgt, compression stocking mgt, and polar care mgt; handout provided to support recall and carryover   Shoulder Instructions      Home Living Family/patient expects to be discharged to:: Private residence Living Arrangements: Alone Available Help at Discharge: Friend(s);Available 24 hours/day(friend staying with her for the first few days) Type of Home: House Home Access: Stairs to enter [employer] of Steps: 3 Entrance Stairs-Rails: Can reach both Home Layout: Two level;Bed/bath upstairs(pt has created a bedroom on main level for temporary use, half bath on main level) Alternate Level Stairs-Number of Steps: 15 stairs Alternate Level Stairs-Rails: Can reach both Bathroom Shower/Tub: Occupational psychologist: Handicapped height     Home Equipment: Environmental consultant - 2 wheels;Shower seat - built Hotel manager: Reacher        Prior Functioning/Environment Level of Independence: Independent        Comments: pt reports being independent with all ADLs/IADLs, 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, reports one fall in last year at the bottom of the stairs stating she missed the last step        OT Problem List: Decreased strength;Decreased range of motion;Pain;Impaired balance (sitting and/or standing);Decreased knowledge of use of DME or AE      OT Treatment/Interventions: Self-care/ADL training;Therapeutic exercise;Therapeutic activities;DME and/or AE instruction;Patient/family education;Balance training    OT Goals(Current goals can be found in the care plan section) Acute Rehab OT Goals Patient Stated Goal: return home and to daily activities OT Goal Formulation: With patient Time For Goal Achievement: 07/03/19 Potential to Achieve Goals: Good ADL Goals Pt Will Perform Lower Body Dressing:  with min guard assist;sit to/from stand;with adaptive equipment Additional ADL Goal #1: Pt will independently instruct family/caregiver in compression stocking mgt Additional ADL Goal #2: Pt will independently instruct family/caregiver in polar care mgt  OT Frequency: Min 1X/week   Barriers to D/C:            Co-evaluation              AM-PAC OT "6 Clicks" Daily Activity     Outcome Measure Help from another person eating meals?: None Help from another person taking care of personal grooming?: None Help from another person toileting, which includes using toliet, bedpan, or urinal?: A Little Help from another person bathing (including washing, rinsing, drying)?: A Little Help from another person to put on and taking off regular upper body clothing?: None Help from another person to put on and taking off regular lower body clothing?: A Little 6 Click Score: 21   End of Session    Activity Tolerance: Patient tolerated treatment well Patient left: in chair;with call bell/phone within reach;with chair alarm set;Other (comment);with SCD's reapplied(polar care in place)  OT Visit Diagnosis: Other abnormalities of gait and mobility (R26.89);Pain Pain - Right/Left: Right Pain - part of body: Knee                Time:  [id number] OT Time Calculation (min): 14 min Charges:  OT General Charges $OT Visit: 1 Visit OT Evaluation $OT Eval Low Complexity: 1 Low OT Treatments $Self Care/Home Management : 8-22 mins  Jeni Salles, MPH, MS, OTR/L ascom [phone] 06/19/19, 1:24 PM

## 2019-06-19 NOTE — TOC Initial Note (Signed)
Transition of Care [facility]) - Initial/Assessment Note    Patient Details  Name: Sabrina Christian MRN: [id number] Date of Birth: 07-19-1951  Transition of Care [facility]) CM/SW Contact:    Su Hilt, RN Phone Number: 06/19/2019, 12:23 PM  Clinical Narrative:                  Met with the patient to discuss DC plan and needs She lives at home alone but has friends that will be staying with her to help She has a RW, Orthoptist but will benefit from a [place], I notified [employer] [facility].  She has chosen [facility] [employer] PT, Helene Kelp has accepted the patient She has friends to provide transportation She is up to date with PCP I requested the price of Lovenox and will notify the patient once obtained  Expected Discharge Plan: [place] Barriers to Discharge: Continued Medical Work up   Patient Goals and CMS Choice Patient states their goals for this hospitalization and ongoing recovery are:: go home CMS Medicare.gov Compare Post Acute Care list provided to:: Patient Choice offered to / list presented to : Patient  Expected Discharge Plan and Services Expected Discharge Plan: [place]   Discharge Planning Services: CM Consult Post Acute Care Choice: [place] arrangements for the past 2 months: Single Family Home                 DME Arranged: 3-N-1 DME Agency: [employer] Date DME Agency Contacted: 06/19/19 Time DME Agency Contacted: 1222 Representative spoke with at DME Agency: [place]: PT [place]: [facility] [employer] Date [place]: 06/19/19 Time [place]: 1222 Representative spoke with at [place]: [place] Arrangements/Services Living arrangements for the past 2 months: [place] Lives with:: Self(friends are going to stay with her for a few days until not needed) Patient language and need for interpreter reviewed:: Yes Do you feel safe going back  to the place where you live?: Yes      Need for Family Participation in Patient Care: No (Comment) Care giver support system in place?: Yes (comment)   Criminal Activity/Legal Involvement Pertinent to Current Situation/Hospitalization: No - Comment as needed  Activities of Daily Living Home Assistive Devices/Equipment: None ADL Screening (condition at time of admission) Patient's cognitive ability adequate to safely complete daily activities?: No Is the patient deaf or have difficulty hearing?: No Does the patient have difficulty seeing, even when wearing glasses/contacts?: No Does the patient have difficulty concentrating, remembering, or making decisions?: No Patient able to express need for assistance with ADLs?: No Does the patient have difficulty dressing or bathing?: No Independently performs ADLs?: Yes (appropriate for developmental age) Does the patient have difficulty walking or climbing stairs?: No Weakness of Legs: None Weakness of Arms/Hands: None  Permission Sought/Granted   Permission granted to share information with : Yes, Verbal Permission Granted              Emotional Assessment Appearance:: Appears stated age Attitude/Demeanor/Rapport: Engaged Affect (typically observed): Appropriate Orientation: : Oriented to Self, Oriented to Place, Oriented to  Time, Oriented to Situation Alcohol / Substance Use: Not Applicable Psych Involvement: No (comment)  Admission diagnosis:  PRIMARY OSTEOARTHRITIS OF RIGHT KNEE. Patient Active Problem List   Diagnosis Date Noted  . Status post total knee replacement using cement, right 06/18/2019   PCP:  Lynnell Jude, MD Pharmacy:  [id number]: [phone]: [phone]    Social Determinants of Health (SDOH) Interventions    Readmission Risk Interventions No flowsheet data found.

## 2019-06-19 NOTE — TOC Progression Note (Signed)
Transition of Care [facility]) - Progression Note    Patient Details  Name: Sabrina Christian MRN: [id number] Date of Birth: 1951/09/07  Transition of Care [facility]) CM/SW [place], RN Phone Number: 06/19/2019, 10:00 AM  Clinical Narrative:     Requested the price of Lovenox       Expected Discharge Plan and Services                                                 Social Determinants of Health (SDOH) Interventions    Readmission Risk Interventions No flowsheet data found.

## 2019-06-19 NOTE — TOC Progression Note (Signed)
Transition of Care [facility]) - Progression Note    Patient Details  Name: Sabrina Christian MRN: [id number] Date of Birth: [birth year]  Transition of Care [facility]) CM/SW [place], RN Phone Number: 06/19/2019, 4:01 PM  Clinical Narrative:     Met with the patient to provide the price of Lovenox at $10, she stated understanding  Expected Discharge Plan: [place] Barriers to Discharge: Continued Medical Work up  Expected Discharge Plan and Services Expected Discharge Plan: [place]   Discharge Planning Services: CM Consult Post Acute Care Choice: [place] arrangements for the past 2 months: Single Family Home                 DME Arranged: 3-N-1 DME Agency: [employer] Date DME Agency Contacted: 06/19/19 Time DME Agency Contacted: 1222 Representative spoke with at DME Agency: [place]: PT [place]: [facility] [employer] Date [place]: 06/19/19 Time [place]: 1222 Representative spoke with at [place]: [place]) Interventions    Readmission Risk Interventions No flowsheet data found.

## 2019-06-19 NOTE — Discharge Summary (Signed)
Physician Discharge Summary  Patient ID: Sabrina Christian MRN: [id number] DOB/AGE: 1950-10-26 68 y.o.  Admit date: 06/18/2019 Discharge date: 06/22/2019  Admission Diagnoses:  PRIMARY OSTEOARTHRITIS OF RIGHT KNEE.  Discharge Diagnoses: Patient Active Problem List   Diagnosis Date Noted  . Status post total knee replacement using cement, right 06/18/2019    Past Medical History:  Diagnosis Date  . Anemia    H/O  . Anxiety   . Complication of anesthesia   . Family history of adverse reaction to anesthesia    MOM-N/V  . GERD (gastroesophageal reflux disease)    OCC-TAKES DGL CHEWABLE  . Headache   . Hypertension   . Hypothyroidism   . PONV (postoperative nausea and vomiting)    DID NOT GET SICK WITH BACK SURGERY   Transfusion: None.   Consultants (if any):   Discharged Condition: Improved  Hospital Course: Sabrina Christian is an 68 y.o. female who was admitted 06/18/2019 with a diagnosis of osteoarthritis of the right knee and went to the operating room on 06/18/2019 and underwent the above named procedures.    Surgeries: Procedure(s): TOTAL KNEE ARTHROPLASTY on 06/18/2019 Patient tolerated the surgery well. Taken to PACU where she was stabilized and then transferred to the orthopedic floor.  Started on Lovenox 40mg  q 24 hrs. Foot pumps applied bilaterally at 80 mm. Heels elevated on bed with rolled towels. No evidence of DVT. Negative Homan. Physical therapy started on day #1 for gait training and transfer. OT started day #1 for ADL and assisted devices.  Patient with moderate edema and swelling to the right leg.  US of the right lower extremity negative for DVT.  Improved with TED hose and wrapping of the knee.  Patient's IV was removed on POD2.  Foley removed on POD1.  Implants: Right TKA using all-cemented [employer] Vanguard system with a 67.5 mm mm PCR femur, a 71 mm tibial tray with a 10 mm anterior stabilized E-poly insert, and a 34 x 8.5 mm all-poly 3-pegged domed  patella.  She was given perioperative antibiotics:  Anti-infectives (From admission, onward)   Start     Dose/Rate Route Frequency Ordered Stop   06/18/19 1700  clindamycin (CLEOCIN) IVPB 900 mg     900 mg 100 mL/hr over 30 Minutes Intravenous Every 6 hours 06/18/19 1504 06/19/19 0612   06/18/19 0908  clindamycin (CLEOCIN) 900 MG/50ML IVPB    Note to Pharmacy: Trudie Reed   : cabinet override      06/18/19 0908 06/18/19 1048   06/17/19 2330  clindamycin (CLEOCIN) IVPB 900 mg     900 mg 100 mL/hr over 30 Minutes Intravenous  Once 06/17/19 2317 06/18/19 1103    .  She was given sequential compression devices, early ambulation, and Lovenox for DVT prophylaxis.  She benefited maximally from the hospital stay and there were no complications.    Recent vital signs:  Vitals:   06/21/19 2332 06/22/19 0728  BP:  134/68  Pulse:  92  Resp:  17  Temp:  99 F (37.2 C)  SpO2: 93% 92%    Recent laboratory studies:  Lab Results  Component Value Date   HGB 10.1 (L) 06/19/2019   HGB 13.5 06/10/2019   Lab Results  Component Value Date   WBC 8.0 06/19/2019   PLT 250 06/19/2019   No results found for: INR Lab Results  Component Value Date   NA 136 06/21/2019   K 4.3 06/21/2019   CL 104 06/21/2019   CO2 28  06/21/2019   BUN 11 06/21/2019   CREATININE 0.90 06/21/2019   GLUCOSE 112 (H) 06/21/2019    Discharge Medications:   Allergies as of 06/22/2019      Reactions   Penicillins Hives, Swelling   Has patient had a PCN reaction causing immediate rash, facial/tongue/throat swelling, SOB or lightheadedness with hypotension: Yes Has patient had a PCN reaction causing severe rash involving mucus membranes or skin necrosis: No Has patient had a PCN reaction that required hospitalization: No Has patient had a PCN reaction occurring within the last 10 years: No If all of the above answers are "NO", then may proceed with Cephalosporin use.   Codeine Nausea And Vomiting   Hctz  [hydrochlorothiazide] Other (See Comments)   Blisters on top/bottom of feet.   Sulfa Antibiotics Hives, Itching      Medication List    TAKE these medications   ALPRAZolam 0.5 MG tablet Commonly known as: XANAX Take 0.5 mg 2 (two) times daily as needed by mouth for anxiety (for anxiety.).   butalbital-acetaminophen-caffeine 50-325-40 MG tablet Commonly known as: FIORICET Take 1 tablet 2 (two) times daily as needed by mouth for headache or migraine.   cyanocobalamin 1000 MCG/ML injection Commonly known as: (VITAMIN B-12) Inject 1,000 mcg every 30 (thirty) days into the muscle.   enoxaparin 40 MG/0.4ML injection Commonly known as: LOVENOX Inject 0.4 mLs (40 mg total) into the skin daily.   escitalopram 5 MG tablet Commonly known as: LEXAPRO Take 5 mg by mouth daily.   ibuprofen 200 MG tablet Commonly known as: ADVIL Take 400 mg by mouth every 8 (eight) hours as needed (pain.).   losartan 50 MG tablet Commonly known as: COZAAR Take 50 mg by mouth daily.   MAGNESIUM PO Take 1 tablet by mouth daily at 2 PM. In the afternoon.   meloxicam 15 MG tablet Commonly known as: MOBIC Take 15 mg by mouth every 3 (three) days.   MOVE FREE PO Take 1 tablet by mouth daily at 2 PM.   multivitamin with minerals Tabs tablet Take 1 tablet by mouth daily at 2 PM. In the afternoon.   ondansetron 4 MG tablet Commonly known as: ZOFRAN Take 1 tablet (4 mg total) by mouth every 6 (six) hours as needed for nausea.   oxyCODONE 5 MG immediate release tablet Commonly known as: Oxy IR/ROXICODONE Take 1-2 tablets (5-10 mg total) by mouth every 4 (four) hours as needed for moderate pain (pain score 4-6).   promethazine 12.5 MG tablet Commonly known as: PHENERGAN Take 1 tablet (12.5 mg total) by mouth every 6 (six) hours as needed for nausea or vomiting.   Synthroid 112 MCG tablet Generic drug: levothyroxine Take 112 mcg daily before breakfast by mouth.   traMADol 50 MG tablet Commonly  known as: ULTRAM Take 1-2 tablets (50-100 mg total) by mouth every 6 (six) hours as needed for moderate pain.   ZINC PO Take 1 tablet by mouth daily at 2 PM. In the afternoon.            Durable Medical Equipment  (From admission, onward)         Start     Ordered   06/18/19 1505  DME 3 n 1  Once     06/18/19 1504   06/18/19 1505  DME Bedside commode  Once    Question:  Patient needs a bedside commode to treat with the following condition  Answer:  Status post total knee replacement using cement, right  06/18/19 1504   06/18/19 1505  DME Walker rolling  Once    Question:  Patient needs a walker to treat with the following condition  Answer:  Status post total knee replacement using cement, right   06/18/19 1504          Diagnostic Studies: US Venous Img Lower Unilateral Right  Result Date: 06/20/2019 CLINICAL DATA:  Right lower extremity pain and edema. Recent right total knee replacement. Evaluate for DVT. EXAM: RIGHT LOWER EXTREMITY VENOUS DOPPLER ULTRASOUND TECHNIQUE: Gray-scale sonography with graded compression, as well as color Doppler and duplex ultrasound were performed to evaluate the lower extremity deep venous systems from the level of the common femoral vein and including the common femoral, femoral, profunda femoral, popliteal and calf veins including the posterior tibial, peroneal and gastrocnemius veins when visible. The superficial great saphenous vein was also interrogated. Spectral Doppler was utilized to evaluate flow at rest and with distal augmentation maneuvers in the common femoral, femoral and popliteal veins. COMPARISON:  None. FINDINGS: Contralateral Common Femoral Vein: Respiratory phasicity is normal and symmetric with the symptomatic side. No evidence of thrombus. Normal compressibility. Common Femoral Vein: No evidence of thrombus. Normal compressibility, respiratory phasicity and response to augmentation. Saphenofemoral Junction: No evidence of  thrombus. Normal compressibility and flow on color Doppler imaging. Profunda Femoral Vein: No evidence of thrombus. Normal compressibility and flow on color Doppler imaging. Femoral Vein: No evidence of thrombus. Normal compressibility, respiratory phasicity and response to augmentation. Popliteal Vein: No evidence of thrombus. Normal compressibility, respiratory phasicity and response to augmentation. Calf Veins: No evidence of thrombus. Normal compressibility and flow on color Doppler imaging. Superficial Great Saphenous Vein: No evidence of thrombus. Normal compressibility. Venous Reflux:  None. Other Findings:  None. IMPRESSION: No evidence of DVT within the right lower extremity. Electronically Signed   By: Sandi Mariscal M.D.   On: 06/20/2019 09:42   Dg Knee Right Port  Result Date: 06/18/2019 CLINICAL DATA:  Postoperative for total knee arthroplasty on the right EXAM: PORTABLE RIGHT KNEE - 1-2 VIEW COMPARISON:  The MRI from 10/24/2018 FINDINGS: Typical findings status post total knee arthroplasty with femoral, tibial, and patellar components in place, anterior skin staples, and gas tracking in the joint and adjacent soft tissues. No appreciable fracture or early complicating feature. IMPRESSION: Satisfactory postoperative appearance of the right total knee arthroplasty. No complicating feature observed. Electronically Signed   By: Van Clines M.D.   On: 06/18/2019 15:14   Disposition: Plan for d/c home on 06/22/19 pending progress with PT.  Follow-up Information    Lattie Corns, PA-C Follow up in 14 day(s).   Specialty: Physician Assistant Why: Electa Sniff information: [place] [address] [phone]          Signed: Judson Roch PA-C 06/22/2019, 7:50 AM

## 2019-06-20 ENCOUNTER — Inpatient Hospital Stay: Payer: BC Managed Care – PPO

## 2019-06-20 LAB — BASIC METABOLIC PANEL
Anion gap: 7 (ref 5–15)
BUN: 16 mg/dL (ref 8–23)
CO2: 24 mmol/L (ref 22–32)
Calcium: 8.6 mg/dL — ABNORMAL LOW (ref 8.9–10.3)
Chloride: 104 mmol/L (ref 98–111)
Creatinine, Ser: 0.96 mg/dL (ref 0.44–1.00)
GFR calc Af Amer: >60 mL/min (ref >60)
GFR calc non Af Amer: >60 mL/min (ref >60)
Glucose, Bld: 122 mg/dL — ABNORMAL HIGH (ref 70–99)
Potassium: 4.1 mmol/L (ref 3.5–5.1)
Sodium: 135 mmol/L (ref 135–145)

## 2019-06-20 NOTE — Progress Notes (Signed)
Physical Therapy Treatment Patient Details Name: Sabrina Christian MRN: [id number] DOB: 01-Sep-1951 Today's Date: 06/20/2019    History of Present Illness 68 yo female admitted s/p R TKR 06/18/19    PT Comments    Pt c/o increased swelling and pain today.  Doppler done this am - negative for DVT.  Participated in exercises as described below.  Hesitant with AAROM with limited ROM allowed.  Min a to edge of bed with heavy use of rails and assist for LE.  Once sitting, she is able to stand with min/mod a x 1 due to excessive forward lean on walker requiring assist to get upright.  She is hesitant to put much weight on her LE during transfer and further gait is deferred at this time due to nausea.  She quickly grabs emesis bag but no vomiting noted.     Follow Up Recommendations  Home health PT     Equipment Recommendations  3in1 (PT)    Recommendations for Other Services       Precautions / Restrictions Precautions Precautions: Fall Restrictions Weight Bearing Restrictions: Yes RLE Weight Bearing: Weight bearing as tolerated    Mobility  Bed Mobility Overal bed mobility: Needs Assistance Bed Mobility: Supine to Sit     Supine to sit: Min assist     General bed mobility comments: for LE management and increased time  Transfers Overall transfer level: Needs assistance Equipment used: Rolling walker (2 wheeled) Transfers: Sit to/from Stand Sit to Stand: Min assist;Mod assist         General transfer comment: difficulty today due to inc pain, increased forward lean to stand on walker requires assist to stand fully upright.  Ambulation/Gait Ambulation/Gait assistance: Min assist Gait Distance (Feet): 3 Feet Assistive device: Rolling walker (2 wheeled) Gait Pattern/deviations: Step-to pattern;Decreased stance time - right;Decreased step length - right;Decreased step length - left;Trunk flexed Gait velocity: decreased   General Gait Details: increased difficulty with gait  today, hesitant to WB on RLE.  generally unsteady due to pain   Stairs             Wheelchair Mobility    Modified Rankin (Stroke Patients Only)       Balance Overall balance assessment: Needs assistance Sitting-balance support: Feet supported;Single extremity supported Sitting balance-Leahy Scale: Good     Standing balance support: Bilateral upper extremity supported Standing balance-Leahy Scale: Poor Standing balance comment: relaint  on RW, pain affecting balance today.                            Cognition Arousal/Alertness: Awake/alert Behavior During Therapy: WFL for tasks assessed/performed Overall Cognitive Status: Within Functional Limits for tasks assessed                                        Exercises Total Joint Exercises Ankle Circles/Pumps: AROM;Both;10 reps Quad Sets: AROM;Right;10 reps Straight Leg Raises: AAROM;Right;10 reps Long Arc Quad: AROM;Right;10 reps Knee Flexion: AROM;Right;10 reps Goniometric ROM: 5-75 - self paces, slow    General Comments        Pertinent Vitals/Pain Pain Assessment: 0-10 Pain Score: 9  Pain Location: R knee from patella to tibial plateau area Pain Descriptors / Indicators: Sharp;Aching;Sore;Operative site guarding;Pins and needles Pain Intervention(s): Limited activity within patient's tolerance;Monitored during session;Premedicated before session;Repositioned;Ice applied    Home Living  Prior Function            PT Goals (current goals can now be found in the care plan section) Progress towards PT goals: Not progressing toward goals - comment    Frequency    BID      PT Plan Current plan remains appropriate    Co-evaluation              AM-PAC PT "6 Clicks" Mobility   Outcome Measure  Help needed turning from your back to your side while in a flat bed without using bedrails?: A Little Help needed moving from lying on your back  to sitting on the side of a flat bed without using bedrails?: A Little Help needed moving to and from a bed to a chair (including a wheelchair)?: A Little Help needed standing up from a chair using your arms (e.g., wheelchair or bedside chair)?: A Lot Help needed to walk in hospital room?: A Lot Help needed climbing 3-5 steps with a railing? : A Lot 6 Click Score: 15    End of Session Equipment Utilized During Treatment: Gait belt Activity Tolerance: Patient limited by pain Patient left: in chair;with call bell/phone within reach Nurse Communication: Mobility status       Time: 1122-1140 PT Time Calculation (min) (ACUTE ONLY): 18 min  Charges:  $Therapeutic Exercise: 8-22 mins                    Chesley Noon, PTA 06/20/19, 2:10 PM

## 2019-06-20 NOTE — Progress Notes (Signed)
MD Rudene Christians notified that pts right leg is very swollen, tender, and appears to have little blister forming. MD notified this nurse he will come to room to see pt. Pt notified.

## 2019-06-20 NOTE — Progress Notes (Signed)
  Subjective: 2 Days Post-Op Procedure(s) (LRB): TOTAL KNEE ARTHROPLASTY (Right) Patient reports pain as well controlled.   Patient is well, and has had no acute complaints or problems.  Patient did experience nausea and vomited during physical therapy yesterday.  She is feeling better at this time. Plan is to go Home after hospital stay. Negative for chest pain and shortness of breath Fever: no   Objective: Vital signs in last 24 hours: Temp:  [97.7 F (36.5 C)-99 F (37.2 C)] 99 F (37.2 C) (10/10 0736) Pulse Rate:  [67-100] 100 (10/10 0736) Resp:  [20] 20 (10/09 2304) BP: (137-152)/(71-80) 139/77 (10/10 0736) SpO2:  [93 %-100 %] 93 % (10/10 0736) Weight:  [88 kg] 88 kg (10/09 1708)  Intake/Output from previous day:  Intake/Output Summary (Last 24 hours) at 06/20/2019 0910 Last data filed at 06/19/2019 1700 Gross per 24 hour  Intake 480 ml  Output -  Net 480 ml    Intake/Output this shift: No intake/output data recorded.  Labs: Recent Labs    06/19/19 0518  HGB 10.1*   Recent Labs    06/19/19 0518  WBC 8.0  RBC 3.56*  HCT 31.6*  PLT 250   Recent Labs    06/19/19 0518 06/20/19 0446  NA 135 135  K 4.0 4.1  CL 105 104  CO2 23 24  BUN 26* 16  CREATININE 1.17* 0.96  GLUCOSE 104* 122*  CALCIUM 8.3* 8.6*   No results for input(s): LABPT, INR in the last 72 hours.   EXAM General - Patient is Alert, Appropriate and Oriented Extremity - Neurovascular intact, tenderness to compression, decreased softness noted of the right calf Dressing/Incision -bandage remains in place. Motor Function - intact, moving foot and toes well on exam.    Assessment/Plan: 2 Days Post-Op Procedure(s) (LRB): TOTAL KNEE ARTHROPLASTY (Right) Active Problems:   Status post total knee replacement using cement, right  Estimated body mass index is 29.5 kg/m as calculated from the following:   Height as of this encounter: 5\' 8"  (1.727 m).   Weight as of this encounter: 88  kg. Advance diet Up with therapy Plan to discharge home, possibly today depending on therapy.  Venous U/S ordered to r/o DVT  DVT Prophylaxis - Lovenox, SCDs Weight-Bearing as tolerated to right leg  Cassell Smiles, PA-C [facility] Orthopaedic Surgery 06/20/2019, 9:10 AM

## 2019-06-20 NOTE — Progress Notes (Addendum)
Physical Therapy Treatment Patient Details Name: Sabrina Christian MRN: [id number] DOB: Jun 26, 1951 Today's Date: 06/20/2019    History of Present Illness 68 yo female admitted s/p R TKR 06/18/19    PT Comments    Pt in recliner, ready to try gait.  Stood and transferred to commode with min a x 1 to void.  She continues to have increased pain and difficulty with WB RLE with gait and transfers.  Orders WBAT but pt self limiting to TTWB this PM.  She is unable to tolerate or progress gait this pm and transfers back to bed.  While sitting EOB she is able to tolerate some self assisted knee flexion sliding foot along floor on towel to reduce friction x 15.  Returns to bed with min a x 1 for LE management.  Plan was possible discharge home today depending on PT tolerance.  Pt overall did poorly with therapy session.  Pain and increased edema primary limiting factors along with nausea which she relates to being pain triggered.  Discussed with primary RN regarding therapy sessions and limited mobility today.  She continues to report sharp stabbing pain along patella and tibial plateau region along with pins and needles sensation in that area.  Edema remains this pm.  Discussed with primary PT on site.   Follow Up Recommendations  Home health PT     Equipment Recommendations  3in1 (PT)    Recommendations for Other Services       Precautions / Restrictions Precautions Precautions: Fall Restrictions Weight Bearing Restrictions: Yes RLE Weight Bearing: Weight bearing as tolerated    Mobility  Bed Mobility Overal bed mobility: Needs Assistance Bed Mobility: Sit to Supine     Supine to sit: Min assist     General bed mobility comments: for LE management and increased time  Transfers Overall transfer level: Needs assistance Equipment used: Rolling walker (2 wheeled) Transfers: Sit to/from Stand Sit to Stand: Min assist         General transfer comment: improved standing this session  but remains limtied by pain with +1 assist for safety  Ambulation/Gait Ambulation/Gait assistance: Min assist Gait Distance (Feet): 3 Feet Assistive device: Rolling walker (2 wheeled) Gait Pattern/deviations: Step-to pattern;Decreased stance time - right;Decreased step length - right;Decreased step length - left;Trunk flexed Gait velocity: decreased   General Gait Details: increased difficulty with gait today, hesitant to WB on RLE.  generally unsteady due to pain   Stairs             Wheelchair Mobility    Modified Rankin (Stroke Patients Only)       Balance Overall balance assessment: Needs assistance Sitting-balance support: Feet supported;Single extremity supported Sitting balance-Leahy Scale: Good     Standing balance support: Bilateral upper extremity supported Standing balance-Leahy Scale: Poor Standing balance comment: relaint  on RW, pain affecting balance today.                            Cognition Arousal/Alertness: Awake/alert Behavior During Therapy: WFL for tasks assessed/performed Overall Cognitive Status: Within Functional Limits for tasks assessed                                        Exercises Total Joint Exercises Ankle Circles/Pumps: AROM;Both;10 reps Quad Sets: AROM;Right;10 reps Straight Leg Raises: AAROM;Right;10 reps Long Arc Quad: AROM;Right;10 reps Knee Flexion:  AROM;Right;10 reps Goniometric ROM: 5-75 - self paces, slow Other Exercises Other Exercises: seated heel slides on towel to encourage flexion but continues with poor tolerance for movement.    General Comments        Pertinent Vitals/Pain Pain Assessment: 0-10 Pain Score: 9  Pain Location: R knee from patella to tibial plateau area Pain Descriptors / Indicators: Sharp;Aching;Sore;Operative site guarding;Pins and needles Pain Intervention(s): Limited activity within patient's tolerance;Monitored during session;Premedicated before  session;Repositioned;Ice applied    Home Living                      Prior Function            PT Goals (current goals can now be found in the care plan section) Progress towards PT goals: Not progressing toward goals - comment    Frequency    BID      PT Plan Current plan remains appropriate    Co-evaluation              AM-PAC PT "6 Clicks" Mobility   Outcome Measure  Help needed turning from your back to your side while in a flat bed without using bedrails?: A Little Help needed moving from lying on your back to sitting on the side of a flat bed without using bedrails?: A Little Help needed moving to and from a bed to a chair (including a wheelchair)?: A Little Help needed standing up from a chair using your arms (e.g., wheelchair or bedside chair)?: A Lot Help needed to walk in hospital room?: A Lot Help needed climbing 3-5 steps with a railing? : A Lot 6 Click Score: 15    End of Session Equipment Utilized During Treatment: Gait belt Activity Tolerance: Patient limited by pain Patient left: in bed;with bed alarm set;with call bell/phone within reach Nurse Communication: Mobility status       Time: [id number] PT Time Calculation (min) (ACUTE ONLY): 15 min  Charges:  $Therapeutic Exercise: 8-22 mins $Therapeutic Activity: 8-22 mins                     Chesley Noon, PTA 06/20/19, 2:20 PM

## 2019-06-20 NOTE — Progress Notes (Signed)
MD Rudene Christians at bedside. MD wrapped right leg in ace bandages and elevated leg on two pillows.

## 2019-06-21 LAB — BASIC METABOLIC PANEL
Anion gap: 4 — ABNORMAL LOW (ref 5–15)
BUN: 11 mg/dL (ref 8–23)
CO2: 28 mmol/L (ref 22–32)
Calcium: 8.8 mg/dL — ABNORMAL LOW (ref 8.9–10.3)
Chloride: 104 mmol/L (ref 98–111)
Creatinine, Ser: 0.9 mg/dL (ref 0.44–1.00)
GFR calc Af Amer: >60 mL/min (ref >60)
GFR calc non Af Amer: >60 mL/min (ref >60)
Glucose, Bld: 112 mg/dL — ABNORMAL HIGH (ref 70–99)
Potassium: 4.3 mmol/L (ref 3.5–5.1)
Sodium: 136 mmol/L (ref 135–145)

## 2019-06-21 NOTE — Progress Notes (Signed)
  Subjective: 3 Days Post-Op Procedure(s) (LRB): TOTAL KNEE ARTHROPLASTY (Right) Patient reports pain as  [moderate].   Patient is [Improved from yesterday.] Plan is to go [Home] after hospital stay. Negative for chest pain and shortness of breath Fever: [no] Gastrointestinal: [positive] for nausea and vomiting. Patient reports brief feeling of nausea at the end of her last PT session, but did not vomit. Patient reports gas, but has not yet had a bowel movement.  Objective: Vital signs in last 24 hours: Temp:  [97.6 F (36.4 C)-98.6 F (37 C)] 97.6 F (36.4 C) (10/11 0751) Pulse Rate:  [89-98] 89 (10/11 0751) Resp:  [18-20] 18 (10/11 0751) BP: (125-147)/(73-80) 125/73 (10/11 0751) SpO2:  [90 %-96 %] 94 % (10/11 0751)  Intake/Output from previous day:  Intake/Output Summary (Last 24 hours) at 06/21/2019 1115 Last data filed at 06/21/2019 0900 Gross per 24 hour  Intake 480 ml  Output -  Net 480 ml    Intake/Output this shift: Total I/O In: 240 [P.O.:240] Out: -   Labs: Recent Labs    06/19/19 0518  HGB 10.1*   Recent Labs    06/19/19 0518  WBC 8.0  RBC 3.56*  HCT 31.6*  PLT 250   Recent Labs    06/20/19 0446 06/21/19 0426  NA 135 136  K 4.1 4.3  CL 104 104  CO2 24 28  BUN 16 11  CREATININE 0.96 0.90  GLUCOSE 122* 112*  CALCIUM 8.6* 8.8*   No results for input(s): LABPT, INR in the last 72 hours.   EXAM General - Patient is [Alert, Appropriate and Oriented] Extremity - [Neurovascular intact Dorsiflexion/Plantar flexion intact Compartment soft Swelling is reduced from yesterday.] Dressing/Incision -dressing remains in place.  Polar Care in place.  Incision is clean with minimal drainage. Motor Function - intact, moving foot and toes well on exam.   Assessment/Plan: 3 Days Post-Op Procedure(s) (LRB): TOTAL KNEE ARTHROPLASTY (Right) Active Problems:   Status post total knee replacement using cement, right  Estimated body mass index is 29.5  kg/m as calculated from the following:   Height as of this encounter: 5\' 8"  (1.727 m).   Weight as of this encounter: 88 kg. [Advance diet Up with therapy Discharge home with home health tomorrow  Ace wraps were removed and pillows were placed under the patient's heels to achieve full extension of the knee.  DVT Prophylaxis - [Lovenox], foot pumps, TED hose Weight-Bearing as tolerated to right leg  Cassell Smiles, PA-C [facility] Orthopaedic Surgery 06/21/2019, 11:15 AM

## 2019-06-21 NOTE — Progress Notes (Signed)
Physical Therapy Treatment Patient Details Name: Sabrina Christian MRN: [id number] DOB: 1951-02-20 Today's Date: 06/21/2019    History of Present Illness 68 yo female admitted s/p R TKR 06/18/19    PT Comments    Patient performs supine to sit with MI and min assist for sit to supine bed mobility. Patient transfers sit to stand with MI and pivots to commode with MI and VC for sequencing. She is able to stand with her right foot flat on the floor and is able to ambulate 80 feet with RW and MI. She performs seated heel slides to approximately  85 deg right knee flex, SAQ with max assist, SLR with max assist , quad sets with hold, Ankle pump x 10. Patient has slow gait speed and has fatigue following ambulation. She will continue to benefit from skilled PT to improve mobility and strength.   Follow Up Recommendations  Home health PT     Equipment Recommendations  3in1 (PT)    Recommendations for Other Services       Precautions / Restrictions Restrictions Weight Bearing Restrictions: Yes RLE Weight Bearing: Weight bearing as tolerated    Mobility  Bed Mobility Overal bed mobility: Modified Independent Bed Mobility: Supine to Sit;Sit to Supine     Supine to sit: Modified independent (Device/Increase time) Sit to supine: Min guard   General bed mobility comments: for LE management and increased time  Transfers Overall transfer level: Modified independent Equipment used: Rolling walker (2 wheeled) Transfers: Sit to/from Stand Sit to Stand: Modified independent (Device/Increase time)         General transfer comment: needs VC for sequencing  Ambulation/Gait Ambulation/Gait assistance: Modified independent (Device/Increase time) Gait Distance (Feet): (80) Assistive device: Rolling walker (2 wheeled) Gait Pattern/deviations: Step-to pattern Gait velocity: (decreased)   General Gait Details: (better foot placement)   Stairs             Wheelchair Mobility     Modified Rankin (Stroke Patients Only)       Balance Overall balance assessment: Modified Independent Sitting-balance support: Bilateral upper extremity supported Sitting balance-Leahy Scale: Good     Standing balance support: Bilateral upper extremity supported Standing balance-Leahy Scale: Fair Standing balance comment: uses RW                            Cognition Arousal/Alertness: Awake/alert Behavior During Therapy: WFL for tasks assessed/performed Overall Cognitive Status: Within Functional Limits for tasks assessed                                        Exercises Total Joint Exercises Ankle Circles/Pumps: AROM;Right;Left;10 reps Quad Sets: AROM;Right;Left;10 reps Hip ABduction/ADduction: (SAQ with total assist x 10 ) Long Arc Quad: AAROM;Right;Left;10 reps Knee Flexion: (aproximately 85 degs right knee flex, ext aprox - 10 deg) Other Exercises Other Exercises: seated heel slides on towel to encourage flexion but continues with poor tolerance for movement.    General Comments        Pertinent Vitals/Pain Pain Assessment: 0-10 Pain Score: 4  Pain Location: (right hip) Pain Descriptors / Indicators: Aching    Home Living                      Prior Function            PT Goals (current goals can now  be found in the care plan section) Acute Rehab PT Goals PT Goal Formulation: With patient Time For Goal Achievement: 06/28/19 Potential to Achieve Goals: Good Progress towards PT goals: Progressing toward goals    Frequency    BID      PT Plan Current plan remains appropriate    Co-evaluation              AM-PAC PT "6 Clicks" Mobility   Outcome Measure  Help needed turning from your back to your side while in a flat bed without using bedrails?: A Little Help needed moving from lying on your back to sitting on the side of a flat bed without using bedrails?: A Little Help needed moving to and from a bed  to a chair (including a wheelchair)?: A Little Help needed standing up from a chair using your arms (e.g., wheelchair or bedside chair)?: A Little Help needed to walk in hospital room?: A Little Help needed climbing 3-5 steps with a railing? : A Little 6 Click Score: 18    End of Session Equipment Utilized During Treatment: Gait belt Activity Tolerance: Patient limited by fatigue Patient left: in bed;with bed alarm set Nurse Communication: Mobility status PT Visit Diagnosis: Muscle weakness (generalized) (M62.81);Difficulty in walking, not elsewhere classified (R26.2)     Time: [id number] PT Time Calculation (min) (ACUTE ONLY): 45 min  Charges:  $Gait Training: 8-22 mins $Therapeutic Exercise: 8-22 mins $Therapeutic Activity: 8-22 mins                     {   Alanson Puls, PT DPT 06/21/2019, 11:55 AM

## 2019-06-22 MED ORDER — ONDANSETRON HCL 4 MG PO TABS: 4.0000 mg | ORAL_TABLET | Freq: Four times a day (QID) | ORAL | 1 refills | Status: DC | PRN

## 2019-06-22 MED ORDER — PROMETHAZINE HCL 12.5 MG PO TABS: 12.5000 mg | ORAL_TABLET | Freq: Four times a day (QID) | ORAL | 0 refills | Status: DC | PRN

## 2019-06-22 NOTE — Progress Notes (Signed)
Physical Therapy Treatment Patient Details Name: Sabrina Christian MRN: [id number] DOB: [birth year] Today's Date: 06/22/2019    History of Present Illness 68 yo female admitted s/p R TKR 06/18/19    PT Comments    Pt agreeable to PT; reports 7/10 pain in R knee. Nurse notified of pain medication request. Pt demonstrates need for assist with RLE in/out of bed. STS with Min guard; educated on adjusting rolling walker for appropriate height and current walker done so. Progressing ambulation to 100 ft and progressed quality with education on proper step lengths, reciprocal pattern and body position to rolling walker; much improved post education and pt notes greater ease with ambulation. Educated on stair climbing as well with Min guard ascending and Min A descending. Pt improving R knee active range of motion -4-85 degrees. Encouraged gravity assisted R knee stretch. Pt prepared to discharge home to continue rehab efforts with HHPT.    Follow Up Recommendations  Home health PT     Equipment Recommendations  3in1 (PT)    Recommendations for Other Services       Precautions / Restrictions Precautions Precautions: Fall Restrictions Weight Bearing Restrictions: Yes RLE Weight Bearing: Weight bearing as tolerated    Mobility  Bed Mobility Overal bed mobility: Needs Assistance Bed Mobility: Supine to Sit;Sit to Supine     Supine to sit: Min assist Sit to supine: Min assist   General bed mobility comments: Assist with RLE to edge of bed and to return to bed  Transfers Overall transfer level: Needs assistance Equipment used: Rolling walker (2 wheeled) Transfers: Sit to/from Stand Sit to Stand: Min guard         General transfer comment: Cues for increased use of RLE to allow for stretch and strengthening  Ambulation/Gait Ambulation/Gait assistance: Min guard Gait Distance (Feet): 100 Feet Assistive device: Rolling walker (2 wheeled) Gait Pattern/deviations: Step-through  pattern;Step-to pattern Gait velocity: decreased Gait velocity interpretation: <1.8 ft/sec, indicate of risk for recurrent falls General Gait Details: Rw too high initially; adjusted for proper height with education. Initial step to pattern with poor body position to rw and step lengths. Post education, pt able to demonstrate step through with good management of rw/body position.    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    Wheelchair Mobility    Modified Rankin (Stroke Patients Only)       Balance Overall balance assessment: Needs assistance Sitting-balance support: Bilateral upper extremity supported Sitting balance-Leahy Scale: Good     Standing balance support: Bilateral upper extremity supported Standing balance-Leahy Scale: Fair                              Cognition Arousal/Alertness: Awake/alert Behavior During Therapy: WFL for tasks assessed/performed Overall Cognitive Status: Within Functional Limits for tasks assessed                                        Exercises Total Joint Exercises Quad Sets: Strengthening;Both;20 reps(also in stand) Knee Flexion: AROM;Right;10 reps;Seated(3 positions each rep with hold in each position) Goniometric ROM: 4-85 Other Exercises Other Exercises: education on increased use of RLE with STS transfers. Education on adjusting rw to appropriate height. Education on  open versus closed position of knee joint for swelling/pain management. Education on activity level, stretching and strengthening activities for conservative/consistent progression.     General Comments        Pertinent Vitals/Pain Pain Assessment: 0-10 Pain Score: 8  Pain Location: R knee Pain Descriptors / Indicators: Aching;Constant;Sore Pain  Intervention(s): Patient requesting pain meds-RN notified    Home Living                      Prior Function            PT Goals (current goals can now be found in the care plan section) Progress towards PT goals: Progressing toward goals    Frequency    BID      PT Plan Current plan remains appropriate    Co-evaluation              AM-PAC PT "6 Clicks" Mobility   Outcome Measure  Help needed turning from your back to your side while in a flat bed without using bedrails?: A Little Help needed moving from lying on your back to sitting on the side of a flat bed without using bedrails?: A Little Help needed moving to and from a bed to a chair (including a wheelchair)?: A Little Help needed standing up from a chair using your arms (e.g., wheelchair or bedside chair)?: A Little Help needed to walk in hospital room?: A Little Help needed climbing 3-5 steps with a railing? : A Little 6 Click Score: 18    End of Session Equipment Utilized During Treatment: Gait belt Activity Tolerance: Patient tolerated treatment well Patient left: in bed;with call bell/phone within reach;with bed alarm set;with SCD's reapplied;Other (comment)(polar care in place)   PT Visit Diagnosis: Muscle weakness (generalized) (M62.81);Difficulty in walking, not elsewhere classified (R26.2)     Time: [id number] PT Time Calculation (min) (ACUTE ONLY): 38 min  Charges:  $Gait Training: 23-37 mins $Therapeutic Exercise: 8-22 mins                      Larae Grooms, PTA 06/22/2019, 1:07 PM

## 2019-06-22 NOTE — Progress Notes (Signed)
Patient discharged with hard rx, discharge instructions, verbalize understanding, by w/c to personal vehicle with son . No incident

## 2019-06-22 NOTE — TOC Transition Note (Signed)
Transition of Care [facility]) - CM/SW Discharge Note   Patient Details  Name: Sabrina Christian MRN: [id number] Date of Birth: 1951-01-22  [facility] CM/SW Contact:  Su Hilt, RN Phone Number: 06/22/2019, 10:08 AM   Clinical Narrative:    Patient to discharge home with [facility] today, The bedside commode was placed in the room and her son has already taken it home, She is aware of the 10$ lovenox price and to pick it up at the pharmacy. NO additional needs   Final next level of care: Home w Home Health Services Barriers to Discharge: Barriers Resolved   Patient Goals and CMS Choice Patient states their goals for this hospitalization and ongoing recovery are:: go home CMS Medicare.gov Compare Post Acute Care list provided to:: Patient Choice offered to / list presented to : Patient  Discharge Placement                       Discharge Plan and Services   Discharge Planning Services: CM Consult Post Acute Care Choice: Home Health          DME Arranged: 3-N-1 DME Agency: [employer] Date DME Agency Contacted: 06/19/19 Time DME Agency Contacted: 1222 Representative spoke with at DME Agency: [place]: PT [place]: [facility] [employer]) Date [place]: 06/19/19 Time [place]: 1222 Representative spoke with at [place]: [place] ([place]) Interventions     Readmission Risk Interventions No flowsheet data found.

## 2019-06-22 NOTE — Progress Notes (Signed)
OT Cancellation Note  Patient Details Name: Sabrina Christian MRN: [id number] DOB: 03-12-1951   Cancelled Treatment:    Reason Eval/Treat Not Completed: Other (comment). OT attempted to see this pt for tx this date. Upon arrival to pt room, pt preparing for DC with NT rolling transport chair into room.   Shara Blazing, M.S., OTR/L Ascom: [phone] 06/22/19, 2:36 PM

## 2019-06-22 NOTE — Discharge Instructions (Signed)
Diet: As you were doing prior to hospitalization   Shower:  May shower but keep the wounds dry, use an occlusive plastic wrap, NO SOAKING IN TUB.  If the bandage gets wet, change with a clean dry gauze.  Dressing:  You may change your dressing as needed. Change the dressing with sterile gauze dressing.    Activity:  Increase activity slowly as tolerated, but follow the weight bearing instructions below.  No lifting or driving for 6 weeks.  Weight Bearing:   Weight bearing as tolerated to right lower extremity  Nausea: Take Zofran as needed for nausea, if continues can take Phenergan.  To prevent constipation: you may use a stool softener such as -  Colace (over the counter) 100 mg by mouth twice a day  Drink plenty of fluids (prune juice may be helpful) and high fiber foods Miralax (over the counter) for constipation as needed.    Itching:  If you experience itching with your medications, try taking only a single pain pill, or even half a pain pill at a time.  You may take up to 10 pain pills per day, and you can also use benadryl over the counter for itching or also to help with sleep.   Precautions:  If you experience chest pain or shortness of breath - call 911 immediately for transfer to the hospital emergency department!!  If you develop a fever greater that 101 F, purulent drainage from wound, increased redness or drainage from wound, or calf pain-Call [place]                                              Follow- Up Appointment:  Please call for an appointment to be seen in 2 weeks at [facility]

## 2019-06-22 NOTE — Progress Notes (Signed)
Subjective: 4 Days Post-Op Procedure(s) (LRB): TOTAL KNEE ARTHROPLASTY (Right) Patient reports pain as moderate but states that her pain has improved when compared to yesterday. Patient is well, does continue to have moderate swelling to the right leg.  Negative for DVT. Plan is to go Home after hospital stay. Negative for chest pain and shortness of breath Fever: no Gastrointestinal:Negative for nausea and vomiting  Objective: Vital signs in last 24 hours: Temp:  [97.6 F (36.4 C)-99 F (37.2 C)] 99 F (37.2 C) (10/12 0728) Pulse Rate:  [85-92] 92 (10/12 0728) Resp:  [17-19] 17 (10/12 0728) BP: (125-135)/(68-75) 134/68 (10/12 0728) SpO2:  [90 %-100 %] 92 % (10/12 0728)  Intake/Output from previous day:  Intake/Output Summary (Last 24 hours) at 06/22/2019 0744 Last data filed at 06/22/2019 0736 Gross per 24 hour  Intake 720 ml  Output 1 ml  Net 719 ml    Intake/Output this shift: Total I/O In: -  Out: 1 [Stool:1]  Labs: No results for input(s): HGB in the last 72 hours. No results for input(s): WBC, RBC, HCT, PLT in the last 72 hours. Recent Labs    06/20/19 0446 06/21/19 0426  NA 135 136  K 4.1 4.3  CL 104 104  CO2 24 28  BUN 16 11  CREATININE 0.96 0.90  GLUCOSE 122* 112*  CALCIUM 8.6* 8.8*   No results for input(s): LABPT, INR in the last 72 hours.   EXAM General - Patient is Alert, Appropriate and Oriented Extremity - ABD soft Neurovascular intact Sensation intact distally Intact pulses distally Dorsiflexion/Plantar flexion intact Incision: dressing C/D/I No cellulitis present  Moderate edema to the right lower extremity. Dressing/Incision - clean, dry, no drainage Motor Function - intact, moving foot and toes well on exam.   Past Medical History:  Diagnosis Date  . Anemia    H/O  . Anxiety   . Complication of anesthesia   . Family history of adverse reaction to anesthesia    MOM-N/V  . GERD (gastroesophageal reflux disease)    OCC-TAKES  DGL CHEWABLE  . Headache   . Hypertension   . Hypothyroidism   . PONV (postoperative nausea and vomiting)    DID NOT GET SICK WITH BACK SURGERY    Assessment/Plan: 4 Days Post-Op Procedure(s) (LRB): TOTAL KNEE ARTHROPLASTY (Right) Active Problems:   Status post total knee replacement using cement, right  Estimated body mass index is 29.5 kg/m as calculated from the following:   Height as of this encounter: 5\' 8"  (1.727 m).   Weight as of this encounter: 88 kg. Advance diet Up with therapy D/C IV fluids when tolerating po intake.  Patient has had a BM. [place] of right leg negative for DVT. Up with therapy today, plan for d/c home with HHPT today. Patient has had a BM. Will d/c home on Zofran and Phenergan as needed for nausea.  DVT Prophylaxis - Lovenox, Foot Pumps and TED hose Weight-Bearing as tolerated to right leg  J. Cameron Proud, PA-C [facility] Orthopaedic [facility] 06/22/2019, 7:44 AM

## 2019-06-22 NOTE — Progress Notes (Signed)
Changed dressing to right knee. Staples intact , no active bleeding noted.

## 2019-07-07 ENCOUNTER — Ambulatory Visit
Admission: RE | Admit: 2019-07-07 | Discharge: 2019-07-07 | Disposition: A | Discharge status: Home / Self Care | Payer: BC Managed Care – PPO | Source: Ambulatory Visit | Attending: Orthopedic Surgery | Admitting: Orthopedic Surgery

## 2019-07-07 ENCOUNTER — Other Ambulatory Visit: Payer: Self-pay | Admitting: Orthopedic Surgery

## 2019-07-07 ENCOUNTER — Other Ambulatory Visit: Payer: Self-pay

## 2019-07-07 DIAGNOSIS — Z96651 Presence of right artificial knee joint: Secondary | ICD-10-CM | POA: Insufficient documentation

## 2019-07-07 DIAGNOSIS — M79604 Pain in right leg: Secondary | ICD-10-CM

## 2020-03-30 ENCOUNTER — Other Ambulatory Visit: Payer: Self-pay | Admitting: Family Medicine

## 2020-03-30 DIAGNOSIS — Z1231 Encounter for screening mammogram for malignant neoplasm of breast: Secondary | ICD-10-CM

## 2020-04-15 ENCOUNTER — Ambulatory Visit
Admission: RE | Admit: 2020-04-15 | Discharge: 2020-04-15 | Disposition: A | Discharge status: Home / Self Care | Payer: BC Managed Care – PPO | Source: Ambulatory Visit | Attending: Family Medicine | Admitting: Family Medicine

## 2020-04-15 ENCOUNTER — Other Ambulatory Visit: Payer: Self-pay

## 2020-04-15 DIAGNOSIS — Z1231 Encounter for screening mammogram for malignant neoplasm of breast: Secondary | ICD-10-CM | POA: Diagnosis present

## 2020-07-20 ENCOUNTER — Encounter: Payer: Self-pay | Admitting: Urology

## 2020-07-20 ENCOUNTER — Ambulatory Visit (INDEPENDENT_AMBULATORY_CARE_PROVIDER_SITE_OTHER): Payer: BC Managed Care – PPO | Admitting: Urology

## 2020-07-20 ENCOUNTER — Other Ambulatory Visit: Payer: Self-pay

## 2020-07-20 VITALS — BP 145/98 | HR 98 | Ht 67.0 in | Wt 180.0 lb

## 2020-07-20 DIAGNOSIS — R300 Dysuria: Secondary | ICD-10-CM | POA: Diagnosis not present

## 2020-07-20 DIAGNOSIS — R350 Frequency of micturition: Secondary | ICD-10-CM

## 2020-07-20 DIAGNOSIS — R102 Pelvic and perineal pain: Secondary | ICD-10-CM | POA: Diagnosis not present

## 2020-07-20 DIAGNOSIS — Z87440 Personal history of urinary (tract) infections: Secondary | ICD-10-CM

## 2020-07-20 LAB — BLADDER SCAN AMB NON-IMAGING: Scan Result: 0

## 2020-07-20 NOTE — Progress Notes (Signed)
07/20/2020 9:05 AM   Sabrina Christian [birth year] [id number]  Referring provider: Lynnell Jude, MD [address] [place],  [place] [address]  No chief complaint on file.   HPI: 69 year old female referred from Dr. Clemmie Krill for further evaluation of urinary tract infection.  She presented to her primary care son October 5 complaining pelvic pain urgency and frequency.  She ultimately was diagnosed with a Klebsiella pneumonia UTI.  She was initially prescribed Macrobid for which she is treated the course.  Sensitivities indicated that there is only partially sensitive to this.  She was then prescribed doxycycline.  Repeat urine culture on 1025 was negative.  Prior to this, her last UTI was in 2017.  Today, she reports that she started having symptoms sometime in September.  Her symptoms are relatively vague and included intermittent lower abdominal pain and back pain.  This was unassociated with voiding.  She did not notice a cloudy urine.  She does have urinary frequency and urgency associated with this.  She has had some mild improvement in her symptoms with a persist somewhat.  She denies any dysuria or gross hematuria.  She does have some baseline stress urinary incontinence with running, laughing and sneezing.  This does not bother her.  She does not wear a pad.  No vaginal symptoms.  She does tend to be constipated but manages this with magnesium which works well.  She has a regular daily bowel movement using this regimen.  Urinalysis today is unremarkable.   PMH: Past Medical History:  Diagnosis Date  . Anemia    H/O  . Anxiety   . Complication of anesthesia   . Family history of adverse reaction to anesthesia    MOM-N/V  . GERD (gastroesophageal reflux disease)    OCC-TAKES DGL CHEWABLE  . Headache   . Hypertension   . Hypothyroidism   . PONV (postoperative nausea and vomiting)    DID NOT GET SICK WITH BACK SURGERY    Surgical History: Past Surgical History:    Procedure Laterality Date  . ABDOMINAL HYSTERECTOMY    . BACK SURGERY  2014   LAMINECTOMY  . CARPAL TUNNEL RELEASE Right   . SHOULDER ARTHROSCOPY WITH ROTATOR CUFF REPAIR Right 07/30/2017   Procedure: SHOULDER ARTHROSCOPY WITH DEBRIDEMENT, DECOMPRESSION, REPAIR OF THE ROTATOR CUFF TEAR WITH A REGENERATION PATCH AND BICEPS TENODESIS;  Surgeon: Corky Mull, MD;  Location: [facility];  Service: Orthopedics;  Laterality: Right;  . TOTAL KNEE ARTHROPLASTY Right 06/18/2019   Procedure: TOTAL KNEE ARTHROPLASTY;  Surgeon: Corky Mull, MD;  Location: [facility];  Service: Orthopedics;  Laterality: Right;    Home Medications:  Allergies as of 07/20/2020      Reactions   Penicillins Hives, Swelling   Has patient had a PCN reaction causing immediate rash, facial/tongue/throat swelling, SOB or lightheadedness with hypotension: Yes Has patient had a PCN reaction causing severe rash involving mucus membranes or skin necrosis: No Has patient had a PCN reaction that required hospitalization: No Has patient had a PCN reaction occurring within the last 10 years: No If all of the above answers are "NO", then may proceed with Cephalosporin use.   Codeine Nausea And Vomiting   Hctz [hydrochlorothiazide] Other (See Comments)   Blisters on top/bottom of feet.   Sulfa Antibiotics Hives, Itching      Medication List       Accurate as of July 20, 2020  9:05 AM. If you have any questions, ask your nurse or doctor.  STOP taking these medications   promethazine 12.5 MG tablet Commonly known as: PHENERGAN Stopped by: Hollice Espy, MD     TAKE these medications   ALPRAZolam 0.5 MG tablet Commonly known as: XANAX Take 0.5 mg 2 (two) times daily as needed by mouth for anxiety (for anxiety.).   aspirin 81 MG EC tablet Take by mouth.   butalbital-acetaminophen-caffeine 50-325-40 MG tablet Commonly known as: FIORICET Take 1 tablet 2 (two) times daily as needed by mouth for headache or  migraine.   cyanocobalamin 1000 MCG/ML injection Commonly known as: (VITAMIN B-12) Inject 1,000 mcg every 30 (thirty) days into the muscle.   enoxaparin 40 MG/0.4ML injection Commonly known as: LOVENOX Inject 0.4 mLs (40 mg total) into the skin daily.   escitalopram 5 MG tablet Commonly known as: LEXAPRO Take 5 mg by mouth daily.   ibuprofen 200 MG tablet Commonly known as: ADVIL Take 400 mg by mouth every 8 (eight) hours as needed (pain.).   losartan 50 MG tablet Commonly known as: COZAAR Take 50 mg by mouth daily.   MAGNESIUM PO Take 1 tablet by mouth daily at 2 PM. In the afternoon.   meloxicam 15 MG tablet Commonly known as: MOBIC Take 15 mg by mouth every 3 (three) days.   MOVE FREE PO Take 1 tablet by mouth daily at 2 PM.   multivitamin with minerals Tabs tablet Take 1 tablet by mouth daily at 2 PM. In the afternoon.   ondansetron 4 MG tablet Commonly known as: ZOFRAN Take 1 tablet (4 mg total) by mouth every 6 (six) hours as needed for nausea.   oxyCODONE 5 MG immediate release tablet Commonly known as: Oxy IR/ROXICODONE Take 1-2 tablets (5-10 mg total) by mouth every 4 (four) hours as needed for moderate pain (pain score 4-6).   Synthroid 112 MCG tablet Generic drug: levothyroxine Take 112 mcg daily before breakfast by mouth.   levothyroxine 125 MCG tablet Commonly known as: SYNTHROID Take 125 mcg by mouth daily.   traMADol 50 MG tablet Commonly known as: ULTRAM Take 1-2 tablets (50-100 mg total) by mouth every 6 (six) hours as needed for moderate pain.   zinc gluconate 50 MG tablet Take 1 tablet by mouth daily.   ZINC PO Take 1 tablet by mouth daily at 2 PM. In the afternoon.       Allergies:  Allergies  Allergen Reactions  . Penicillins Hives and Swelling    Has patient had a PCN reaction causing immediate rash, facial/tongue/throat swelling, SOB or lightheadedness with hypotension: Yes Has patient had a PCN reaction causing severe rash  involving mucus membranes or skin necrosis: No Has patient had a PCN reaction that required hospitalization: No Has patient had a PCN reaction occurring within the last 10 years: No If all of the above answers are "NO", then may proceed with Cephalosporin use.   . Codeine Nausea And Vomiting  . Hctz [Hydrochlorothiazide] Other (See Comments)    Blisters on top/bottom of feet.  . Sulfa Antibiotics Hives and Itching    Family History: Family History  Problem Relation Age of Onset  . Breast cancer Cousin        2 first cousins on maternal side    Social History:  reports that she has never smoked. She has never used smokeless tobacco. She reports current alcohol use. She reports that she does not use drugs.   Physical Exam: BP (!) 145/98   Pulse 98   Ht 5\' 7"  (1.702 m)  Wt 180 lb (81.6 kg)   BMI 28.19 kg/m   Constitutional:  Alert and oriented, No acute distress. HEENT: [place] AT, moist mucus membranes.  Trachea midline, no masses. Cardiovascular: No clubbing, cyanosis, or edema. Respiratory: Normal respiratory effort, no increased work of breathing. Neurologic: Grossly intact, no focal deficits, moving all 4 extremities. Psychiatric: Normal mood and affect.  Laboratory Data: Lab Results  Component Value Date   WBC 8.0 06/19/2019   HGB 10.1 (L) 06/19/2019   HCT 31.6 (L) 06/19/2019   MCV 88.8 06/19/2019   PLT 250 06/19/2019    Lab Results  Component Value Date   CREATININE 0.90 06/21/2019    Urinalysis UA today is negative  Pertinent Imaging: Results for orders placed or performed in visit on 07/20/20  BLADDER SCAN AMB NON-IMAGING  Result Value Ref Range   Scan Result 0 ml     Assessment & Plan:    1. History of UTI Recent Klebsiella pneumonia UTI which appears to be adequately treated  Urinalysis today is negative, previous follow-up culture was also negative.  Will repeat culture again today given ongoing symptoms but do not suspect ongoing infection.  We  discussed that this point, her infections are fairly rare and she does not need any further work-up at this time.  We did discuss hygiene, behavioral modification as well as the addition of probiotics and/or cranberry tablets for UTI prevention.  - Urinalysis, Complete - BLADDER SCAN AMB NON-IMAGING - CULTURE, URINE COMPREHENSIVE  2. Pelvic pain in female Intermittent lower abdominal pain unassociated voiding, question whether this is GU related or not  Adequate bladder emptying which is reassuring  3. Urinary frequency Increase in urinary frequency following recent infection  We discussed behavioral modification for this, may be residual bladder instability from recent UTI  Anticipate gradual improvement  We discussed options for pharmacotherapy including anticholinergics versus beta 3 agonist, she declined today will let us know if her symptoms fail to resolve within the next month   Hollice Espy, MD  [facility] [address] [place], [place] [address] [phone]

## 2020-07-21 LAB — URINALYSIS, COMPLETE
Bilirubin, UA: NEGATIVE
Glucose, UA: NEGATIVE
Leukocytes,UA: NEGATIVE
Nitrite, UA: NEGATIVE
Protein,UA: NEGATIVE
Specific Gravity, UA: >1.03 — ABNORMAL HIGH (ref 1.005–1.030)
Urobilinogen, Ur: 0.2 mg/dL (ref 0.2–1.0)
pH, UA: 5 (ref 5.0–7.5)

## 2020-07-21 LAB — MICROSCOPIC EXAMINATION

## 2020-07-25 LAB — CULTURE, URINE COMPREHENSIVE

## 2020-12-30 ENCOUNTER — Other Ambulatory Visit: Payer: Self-pay

## 2020-12-30 ENCOUNTER — Ambulatory Visit
Admission: RE | Admit: 2020-12-30 | Discharge: 2020-12-30 | Disposition: A | Discharge status: Home / Self Care | Payer: Medicare Other | Source: Ambulatory Visit | Attending: Emergency Medicine | Admitting: Emergency Medicine

## 2020-12-30 VITALS — BP 112/73 | HR 111 | Temp 98.2°F | Resp 16 | Wt 178.0 lb

## 2020-12-30 DIAGNOSIS — J069 Acute upper respiratory infection, unspecified: Secondary | ICD-10-CM | POA: Diagnosis present

## 2020-12-30 DIAGNOSIS — J029 Acute pharyngitis, unspecified: Secondary | ICD-10-CM | POA: Diagnosis present

## 2020-12-30 LAB — POCT RAPID STREP A (OFFICE): Rapid Strep A Screen: NEGATIVE

## 2020-12-30 MED ORDER — BENZONATATE 100 MG PO CAPS: 100.0000 mg | ORAL_CAPSULE | Freq: Three times a day (TID) | ORAL | 0 refills | Status: AC | PRN

## 2020-12-30 NOTE — ED Triage Notes (Signed)
Patient presents to Urgent Care with complaints of sore throat, bilateral ear pain, and nasal congestion x 4 days . Treating symptoms with otc sinus meds.   Denies fever, n/v, abdominal pain, or diarrhea.

## 2020-12-30 NOTE — Discharge Instructions (Addendum)
Your COVID test is pending.  You should self quarantine until the test result is back.    Take Tylenol as needed for fever or discomfort.  Take plain over-the-counter Mucinex as needed for congestion.  Take the prescribed Tessalon Perles as needed for cough.  Rest and keep yourself hydrated.    Follow-up with your primary care provider if your symptoms are not improving.

## 2020-12-30 NOTE — ED Provider Notes (Signed)
Sabrina Christian    CSN: [id number] Arrival date & time: 12/30/20  4098      History   Chief Complaint Chief Complaint  Patient presents with  . Appointment    1000  . Sore Throat  . Nasal Congestion    HPI Sabrina Christian is a 70 y.o. female.   Patient presents with 4-day history of sore throat, ear pain, nasal congestion, postnasal drip, cough.  She denies fever, chills, rash, shortness of breath, vomiting, diarrhea, or other symptoms.  Treatment attempted at home with OTC sinus medication.  Her medical history includes hypertension, hypothyroidism, GERD, anemia, headache, chronic knee pain, anxiety.  The history is provided by the patient and medical records.    Past Medical History:  Diagnosis Date  . Anemia    H/O  . Anxiety   . Complication of anesthesia   . Family history of adverse reaction to anesthesia    MOM-N/V  . GERD (gastroesophageal reflux disease)    OCC-TAKES DGL CHEWABLE  . Headache   . Hypertension   . Hypothyroidism   . PONV (postoperative nausea and vomiting)    DID NOT GET SICK WITH BACK SURGERY    Patient Active Problem List   Diagnosis Date Noted  . 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 ABDOMINAL HYSTERECTOMY    . BACK SURGERY  2014   LAMINECTOMY  . CARPAL TUNNEL RELEASE Right   . SHOULDER ARTHROSCOPY WITH ROTATOR CUFF REPAIR Right 07/30/2017   Procedure: SHOULDER ARTHROSCOPY WITH DEBRIDEMENT, DECOMPRESSION, REPAIR OF THE ROTATOR CUFF TEAR WITH A REGENERATION PATCH AND BICEPS TENODESIS;  Surgeon: Corky Mull, MD;  Location: [facility];  Service: Orthopedics;  Laterality: Right;  . TOTAL KNEE ARTHROPLASTY Right 06/18/2019   Procedure: TOTAL KNEE ARTHROPLASTY;  Surgeon: Corky Mull, MD;  Location: [facility];  Service: Orthopedics;  Laterality: Right;    OB History   No obstetric history on file.      Home Medications    Prior to Admission  medications   Medication Sig Start Date End Date Taking? Authorizing Provider  benzonatate (TESSALON) 100 MG capsule Take 1 capsule (100 mg total) by mouth 3 (three) times daily as needed for cough. 12/30/20  Yes Sharion Balloon, NP  ALPRAZolam Duanne Moron) 0.5 MG tablet Take 0.5 mg 2 (two) times daily as needed by mouth for anxiety (for anxiety.).  06/03/17   [provider]  aspirin 81 MG EC tablet Take by mouth.    [provider]  butalbital-acetaminophen-caffeine (FIORICET, ESGIC) 50-325-40 MG tablet Take 1 tablet 2 (two) times daily as needed by mouth for headache or migraine.    [provider]  cyanocobalamin (,VITAMIN B-12,) 1000 MCG/ML injection Inject 1,000 mcg every 30 (thirty) days into the muscle.    [provider]  enoxaparin (LOVENOX) 40 MG/0.4ML injection Inject 0.4 mLs (40 mg total) into the skin daily. 06/20/19   Lattie Corns, PA-C  escitalopram (LEXAPRO) 5 MG tablet Take 5 mg by mouth daily.  06/09/17   [provider]  Glucosamine-Chondroitin (MOVE FREE PO) Take 1 tablet by mouth daily at 2 PM.     [provider]  ibuprofen (ADVIL,MOTRIN) 200 MG tablet Take 400 mg by mouth every 8 (eight) hours as needed (pain.).     [provider]  levothyroxine (SYNTHROID) 125 MCG tablet Take 125 mcg by mouth daily. 05/06/20   [provider]  losartan (COZAAR) 50 MG tablet Take 50 mg by mouth daily. 02/06/19   [provider]  MAGNESIUM PO Take 1 tablet by mouth daily at 2 PM. In the afternoon.    [provider]  meloxicam (MOBIC) 15 MG tablet Take 15 mg by mouth every 3 (three) days. 04/14/19   [provider]  Multiple Vitamin (MULTIVITAMIN WITH MINERALS) TABS tablet Take 1 tablet by mouth daily at 2 PM. In the afternoon.    [provider]  Multiple Vitamins-Minerals (ZINC PO) Take 1 tablet by mouth daily at 2 PM. In the afternoon.    [provider]  ondansetron (ZOFRAN) 4 MG  tablet Take 1 tablet (4 mg total) by mouth every 6 (six) hours as needed for nausea. 06/22/19   Lattie Corns, PA-C  oxyCODONE (OXY IR/ROXICODONE) 5 MG immediate release tablet Take 1-2 tablets (5-10 mg total) by mouth every 4 (four) hours as needed for moderate pain (pain score 4-6). 06/19/19   Lattie Corns, PA-C  SYNTHROID 112 MCG tablet Take 112 mcg daily before breakfast by mouth. 05/06/17   [provider]  traMADol (ULTRAM) 50 MG tablet Take 1-2 tablets (50-100 mg total) by mouth every 6 (six) hours as needed for moderate pain. 06/19/19   Lattie Corns, PA-C  zinc gluconate 50 MG tablet Take 1 tablet by mouth daily.    [provider]    Family History Family History  Problem Relation Age of Onset  . Breast cancer Cousin        2 first cousins on maternal side  . Hypertension Mother   . Alzheimer's disease Mother   . Heart failure Father   . Alzheimer's disease Father     Social History Social History   Tobacco Use  . Smoking status: Never Smoker  . Smokeless tobacco: Never Used  Vaping Use  . Vaping Use: Never used  Substance Use Topics  . Alcohol use: Yes    Comment: OCC WINE 3x WEEK  . Drug use: No     Allergies   Penicillins, Codeine, Hctz [hydrochlorothiazide], and Sulfa antibiotics   Review of Systems Review of Systems  Constitutional: Negative for chills and fever.  HENT: Positive for congestion, ear pain, postnasal drip and sore throat.   Eyes: Negative for pain and visual disturbance.  Respiratory: Positive for cough. Negative for shortness of breath.   Cardiovascular: Negative for chest pain and palpitations.  Gastrointestinal: Negative for abdominal pain, diarrhea and vomiting.  Genitourinary: Negative for dysuria and hematuria.  Musculoskeletal: Negative for arthralgias and back pain.  Skin: Negative for color change and rash.  Neurological: Negative for seizures and syncope.  All other systems reviewed and are  negative.    Physical Exam Triage Vital Signs ED Triage Vitals  Enc Vitals Group     BP      Pulse      Resp      Temp      Temp src      SpO2      Weight      Height      Head Circumference      Peak Flow      Pain Score      Pain Loc      Pain Edu?      Excl. in [place]?    No data found.  Updated Vital Signs BP 112/73 (BP Location: Left Arm)   Pulse (S) (!) 111 Comment: reports baseline is  high  Temp 98.2 F (36.8 C) (Temporal)   Resp 16   Wt 178 lb (80.7 kg)   SpO2 97%   BMI 27.88 kg/m   Visual Acuity Right Eye Distance:   Left Eye Distance:   Bilateral Distance:    Right Eye Near:   Left Eye Near:    Bilateral Near:     Physical Exam Vitals and nursing note reviewed.  Constitutional:      General: She is not in acute distress.    Appearance: She is well-developed.  HENT:     Head: Normocephalic and atraumatic.     Right Ear: Tympanic membrane normal.     Left Ear: Tympanic membrane normal.     Nose: Nose normal.     Mouth/Throat:     Mouth: Mucous membranes are moist.     Pharynx: Posterior oropharyngeal erythema present.  Eyes:     Conjunctiva/sclera: Conjunctivae normal.  Cardiovascular:     Rate and Rhythm: Normal rate and regular rhythm.     Heart sounds: Normal heart sounds.  Pulmonary:     Effort: Pulmonary effort is normal. No respiratory distress.     Breath sounds: Normal breath sounds.  Abdominal:     Palpations: Abdomen is soft.     Tenderness: There is no abdominal tenderness. There is no guarding or rebound.  Musculoskeletal:     Cervical back: Neck supple.  Skin:    General: Skin is warm and dry.     Findings: No rash.  Neurological:     General: No focal deficit present.     Mental Status: She is alert and oriented to person, place, and time.     Gait: Gait normal.  Psychiatric:        Mood and Affect: Mood normal.        Behavior: Behavior normal.      UC Treatments / Results  Labs (all labs ordered are listed, but  only abnormal results are displayed) Labs Reviewed  NOVEL CORONAVIRUS, NAA  CULTURE, GROUP A STREP [facility])  POCT RAPID STREP A (OFFICE)    EKG   Radiology No results found.  Procedures Procedures (including critical care time)  Medications Ordered in UC Medications - No data to display  Initial Impression / Assessment and Plan / UC Course  I have reviewed the triage vital signs and the nursing notes.  Pertinent labs & imaging results that were available during my care of the patient were reviewed by me and considered in my medical decision making (see chart for details).   Viral URI with cough, sore throat.  Rapid strep negative; culture pending.  Treating cough with Tessalon Perles.  COVID pending.  Instructed patient to self quarantine until the test result is back.  Discussed symptomatic treatment including Tylenol, Mucinex, rest, hydration.  Instructed patient to follow up with PCP if her symptoms are not improving.  Patient agrees to plan of care.    Final Clinical Impressions(s) / UC Diagnoses   Final diagnoses:  Viral URI with cough  Sore throat     Discharge Instructions     Your COVID test is pending.  You should self quarantine until the test result is back.    Take Tylenol as needed for fever or discomfort.  Take plain over-the-counter Mucinex as needed for congestion.  Take the prescribed Tessalon Perles as needed for cough.  Rest and keep yourself hydrated.    Follow-up with your primary care provider if your symptoms are not  improving.        ED Prescriptions    Medication Sig Dispense Auth. Provider   benzonatate (TESSALON) 100 MG capsule Take 1 capsule (100 mg total) by mouth 3 (three) times daily as needed for cough. 21 capsule Sharion Balloon, NP     PDMP not reviewed this encounter.   Sharion Balloon, NP 12/30/20 1032

## 2020-12-31 LAB — SARS-COV-2, NAA 2 DAY TAT

## 2020-12-31 LAB — NOVEL CORONAVIRUS, NAA: SARS-CoV-2, NAA: NOT DETECTED

## 2021-01-02 LAB — CULTURE, GROUP A STREP (THRC)

## 2021-01-04 ENCOUNTER — Ambulatory Visit: Payer: Medicare Other | Admitting: Dermatology

## 2021-01-04 ENCOUNTER — Other Ambulatory Visit: Payer: Self-pay

## 2021-01-04 DIAGNOSIS — L814 Other melanin hyperpigmentation: Secondary | ICD-10-CM | POA: Diagnosis not present

## 2021-01-04 DIAGNOSIS — Z1283 Encounter for screening for malignant neoplasm of skin: Secondary | ICD-10-CM

## 2021-01-04 DIAGNOSIS — L821 Other seborrheic keratosis: Secondary | ICD-10-CM

## 2021-01-04 DIAGNOSIS — D229 Melanocytic nevi, unspecified: Secondary | ICD-10-CM

## 2021-01-04 DIAGNOSIS — L578 Other skin changes due to chronic exposure to nonionizing radiation: Secondary | ICD-10-CM | POA: Diagnosis not present

## 2021-01-04 DIAGNOSIS — L820 Inflamed seborrheic keratosis: Secondary | ICD-10-CM | POA: Diagnosis not present

## 2021-01-04 NOTE — Patient Instructions (Addendum)
Melanoma ABCDEs  Melanoma is the most dangerous type of skin cancer, and is the leading cause of death from skin disease.  You are more likely to develop melanoma if you:  Have light-colored skin, light-colored eyes, or red or blond hair  Spend a lot of time in the sun  Tan regularly, either outdoors or in a tanning bed  Have had blistering sunburns, especially during childhood  Have a close family member who has had a melanoma  Have atypical moles or large birthmarks  Early detection of melanoma is key since treatment is typically straightforward and cure rates are extremely high if we catch it early.   The first sign of melanoma is often a change in a mole or a new dark spot.  The ABCDE system is a way of remembering the signs of melanoma.  A for asymmetry:  The two halves do not match. B for border:  The edges of the growth are irregular. C for color:  A mixture of colors are present instead of an even brown color. D for diameter:  Melanomas are usually (but not always) greater than 50mm - the size of a pencil eraser. E for evolution:  The spot keeps changing in size, shape, and color.  Please check your skin once per month between visits. You can use a small mirror in front and a large mirror behind you to keep an eye on the back side or your body.   If you see any new or changing lesions before your next follow-up, please call to schedule a visit.  Please continue daily skin protection including broad spectrum sunscreen SPF 30+ to sun-exposed areas, reapplying every 2 hours as needed when you're outdoors.   Staying in the shade or wearing long sleeves, sun glasses (UVA+UVB protection) and wide brim hats (4-inch brim around the entire circumference of the hat) are also recommended for sun protection.    Seborrheic Keratosis  What causes seborrheic keratoses? Seborrheic keratoses are harmless, common skin growths that first appear during adult life.  As time goes by, more  growths appear.  Some people may develop a large number of them.  Seborrheic keratoses appear on both covered and uncovered body parts.  They are not caused by sunlight.  The tendency to develop seborrheic keratoses can be inherited.  They vary in color from skin-colored to gray, brown, or even black.  They can be either smooth or have a rough, warty surface.   Seborrheic keratoses are superficial and look as if they were stuck on the skin.  Under the microscope this type of keratosis looks like layers upon layers of skin.  That is why at times the top layer may seem to fall off, but the rest of the growth remains and re-grows.    Treatment Seborrheic keratoses do not need to be treated, but can easily be removed in the office.  Seborrheic keratoses often cause symptoms when they rub on clothing or jewelry.  Lesions can be in the way of shaving.  If they become inflamed, they can cause itching, soreness, or burning.  Removal of a seborrheic keratosis can be accomplished by freezing, burning, or surgery. If any spot bleeds, scabs, or grows rapidly, please return to have it checked, as these can be an indication of a skin cancer.    Cryotherapy Aftercare  . Wash gently with soap and water everyday.   Marland Kitchen Apply Vaseline and Band-Aid daily until healed.   If you have any questions or concerns  for your doctor, please call our main line at [phone] and press option 4 to reach your doctor's medical assistant. If no one answers, please leave a voicemail as directed and we will return your call as soon as possible. Messages left after 4 pm will be answered the following business day.   You may also send [place] a message via [place]. We typically respond to MyChart messages within 1-2 business days.  For prescription refills, please ask your pharmacy to contact our office. Our fax number is [phone].  If you have an urgent issue when the clinic is closed that cannot wait until the next business day, you  can page your doctor at the number below.    Please note that while we do our best to be available for urgent issues outside of office hours, we are not available 24/7.   If you have an urgent issue and are unable to reach [place], you may choose to seek medical care at your doctor's office, retail clinic, urgent care center, or emergency room.  If you have a medical emergency, please immediately call 911 or go to the emergency department.  Pager Numbers  - Dr. Nehemiah Massed: [phone]  - Dr. Laurence Ferrari: [phone]  - Dr. Nicole Kindred: [phone]  In the event of inclement weather, please call our main line at [phone] for an update on the status of any delays or closures.  Dermatology Medication Tips: Please keep the boxes that topical medications come in in order to help keep track of the instructions about where and how to use these. Pharmacies typically print the medication instructions only on the boxes and not directly on the medication tubes.   If your medication is too expensive, please contact our office at [phone] option 4 or send [place] a message through [place].   We are unable to tell what your co-pay for medications will be in advance as this is different depending on your insurance coverage. However, we may be able to find a substitute medication at lower cost or fill out paperwork to get insurance to cover a needed medication.   If a prior authorization is required to get your medication covered by your insurance company, please allow [place] 1-2 business days to complete this process.  Drug prices often vary depending on where the prescription is filled and some pharmacies may offer cheaper prices.  The website www.goodrx.com contains coupons for medications through different pharmacies. The prices here do not account for what the cost may be with help from insurance (it may be cheaper with your insurance), but the website can give you the price if you did not use any insurance.  -  You can print the associated coupon and take it with your prescription to the pharmacy.  - You may also stop by our office during regular business hours and pick up a GoodRx coupon card.  - If you need your prescription sent electronically to a different pharmacy, notify our office through [facility] or by phone at [phone] option 4.

## 2021-01-04 NOTE — Progress Notes (Signed)
   New Patient Visit  Subjective  Sabrina Christian is a 70 y.o. female who presents for the following: TBSE. She has an itchy growth on her upper back, present for about 6 months. She also has irritated spots on her right side/braline. No history of skin cancer.   The following portions of the chart were reviewed this encounter and updated as appropriate:       Review of Systems:  No other skin or systemic complaints except as noted in HPI or Assessment and Plan.  Objective  Well appearing patient in no apparent distress; mood and affect are within normal limits.  A full examination was performed including scalp, head, eyes, ears, nose, lips, neck, chest, axillae, abdomen, back, buttocks, bilateral upper extremities, bilateral lower extremities, hands, feet, fingers, toes, fingernails, and toenails. All findings within normal limits unless otherwise noted below.  Objective  L medial knee x 1, spinal upper back x 1, R upper arm x 1, R ant thigh x 1, R flank at braline x 2 (6): Erythematous keratotic or waxy stuck-on papule  3 mm waxy dark brown papule R upper arm   Assessment & Plan   Skin cancer screening performed today.  Actinic Damage - chronic, secondary to cumulative UV radiation exposure/sun exposure over time - diffuse scaly erythematous macules with underlying dyspigmentation - Recommend daily broad spectrum sunscreen SPF 30+ to sun-exposed areas, reapply every 2 hours as needed.  - Recommend staying in the shade or wearing long sleeves, sun glasses (UVA+UVB protection) and wide brim hats (4-inch brim around the entire circumference of the hat). - Call for new or changing lesions.  Lentigines - Scattered tan macules - Due to sun exposure - Benign-appering, observe - Recommend daily broad spectrum sunscreen SPF 30+ to sun-exposed areas, reapply every 2 hours as needed. - Call for any changes  Seborrheic Keratoses - Stuck-on, waxy, tan-brown papules and/or plaques  -  Benign-appearing - Discussed benign etiology and prognosis. - Observe - Call for any changes  Melanocytic Nevi - Tan-brown and/or pink-flesh-colored symmetric macules and papules, including sacrum - Benign appearing on exam today - Observation - Call clinic for new or changing moles - Recommend daily use of broad spectrum spf 30+ sunscreen to sun-exposed areas.   Inflamed seborrheic keratosis (6) L medial knee x 1, spinal upper back x 1, R upper arm x 1, R ant thigh x 1, R flank at braline x 2  vs Skin tag/nevus (R flank at braline, superior lesion). Discussed shave removal if not clear with cryotherapy.  Prior to procedure, discussed risks of blister formation, small wound, skin dyspigmentation, or rare scar following cryotherapy.   Recheck right upper arm.  Destruction of lesion - L medial knee x 1, spinal upper back x 1, R upper arm x 1, R ant thigh x 1, R flank at braline x 2  Destruction method: cryotherapy   Informed consent: discussed and consent obtained   Lesion destroyed using liquid nitrogen: Yes   Region frozen until ice ball extended beyond lesion: Yes   Outcome: patient tolerated procedure well with no complications   Post-procedure details: wound care instructions given    Return in about 1 year (around 01/04/2022) for TBSE.   IJamesetta Orleans, CMA, am acting as scribe for Brendolyn Patty, MD .  Documentation: I have reviewed the above documentation for accuracy and completeness, and I agree with the above.  Brendolyn Patty MD

## 2021-03-21 ENCOUNTER — Other Ambulatory Visit: Payer: Self-pay | Admitting: Family Medicine

## 2021-03-21 DIAGNOSIS — Z1231 Encounter for screening mammogram for malignant neoplasm of breast: Secondary | ICD-10-CM

## 2021-04-17 ENCOUNTER — Other Ambulatory Visit: Payer: Self-pay

## 2021-04-17 ENCOUNTER — Ambulatory Visit
Admission: RE | Admit: 2021-04-17 | Discharge: 2021-04-17 | Disposition: A | Discharge status: Home / Self Care | Payer: Medicare Other | Source: Ambulatory Visit | Attending: Family Medicine | Admitting: Family Medicine

## 2021-04-17 DIAGNOSIS — Z1231 Encounter for screening mammogram for malignant neoplasm of breast: Secondary | ICD-10-CM

## 2022-03-08 ENCOUNTER — Other Ambulatory Visit: Payer: Self-pay | Admitting: Family Medicine

## 2022-03-08 DIAGNOSIS — Z1231 Encounter for screening mammogram for malignant neoplasm of breast: Secondary | ICD-10-CM

## 2022-04-18 ENCOUNTER — Ambulatory Visit
Admission: RE | Admit: 2022-04-18 | Discharge: 2022-04-18 | Disposition: A | Discharge status: Home / Self Care | Payer: Medicare Other | Source: Ambulatory Visit | Attending: Family Medicine | Admitting: Family Medicine

## 2022-04-18 DIAGNOSIS — Z1231 Encounter for screening mammogram for malignant neoplasm of breast: Secondary | ICD-10-CM

## 2022-06-18 ENCOUNTER — Encounter: Payer: Self-pay | Admitting: *Deleted

## 2022-06-19 ENCOUNTER — Encounter
Admission: RE | Disposition: A | Discharge status: Home / Self Care | Payer: Self-pay | Source: Home / Self Care | Attending: Gastroenterology

## 2022-06-19 ENCOUNTER — Other Ambulatory Visit: Payer: Self-pay

## 2022-06-19 ENCOUNTER — Ambulatory Visit: Payer: Medicare Other | Admitting: Anesthesiology

## 2022-06-19 ENCOUNTER — Ambulatory Visit
Admission: RE | Admit: 2022-06-19 | Discharge: 2022-06-19 | Disposition: A | Discharge status: Home / Self Care | Payer: Medicare Other | Attending: Gastroenterology | Admitting: Gastroenterology

## 2022-06-19 ENCOUNTER — Encounter: Payer: Self-pay | Admitting: *Deleted

## 2022-06-19 DIAGNOSIS — K6389 Other specified diseases of intestine: Secondary | ICD-10-CM | POA: Diagnosis not present

## 2022-06-19 DIAGNOSIS — Z83719 Family history of colon polyps, unspecified: Secondary | ICD-10-CM | POA: Diagnosis not present

## 2022-06-19 DIAGNOSIS — D123 Benign neoplasm of transverse colon: Secondary | ICD-10-CM | POA: Diagnosis not present

## 2022-06-19 DIAGNOSIS — R519 Headache, unspecified: Secondary | ICD-10-CM | POA: Diagnosis not present

## 2022-06-19 DIAGNOSIS — K640 First degree hemorrhoids: Secondary | ICD-10-CM | POA: Insufficient documentation

## 2022-06-19 DIAGNOSIS — E039 Hypothyroidism, unspecified: Secondary | ICD-10-CM | POA: Insufficient documentation

## 2022-06-19 DIAGNOSIS — Z1211 Encounter for screening for malignant neoplasm of colon: Secondary | ICD-10-CM | POA: Insufficient documentation

## 2022-06-19 DIAGNOSIS — K5730 Diverticulosis of large intestine without perforation or abscess without bleeding: Secondary | ICD-10-CM | POA: Diagnosis not present

## 2022-06-19 DIAGNOSIS — F419 Anxiety disorder, unspecified: Secondary | ICD-10-CM | POA: Insufficient documentation

## 2022-06-19 DIAGNOSIS — Z96651 Presence of right artificial knee joint: Secondary | ICD-10-CM | POA: Insufficient documentation

## 2022-06-19 DIAGNOSIS — I10 Essential (primary) hypertension: Secondary | ICD-10-CM | POA: Diagnosis not present

## 2022-06-19 DIAGNOSIS — Z79899 Other long term (current) drug therapy: Secondary | ICD-10-CM | POA: Diagnosis not present

## 2022-06-19 DIAGNOSIS — K635 Polyp of colon: Secondary | ICD-10-CM | POA: Diagnosis not present

## 2022-06-19 HISTORY — PX: COLONOSCOPY WITH PROPOFOL: SHX5780

## 2022-06-19 SURGERY — COLONOSCOPY WITH PROPOFOL
Anesthesia: General

## 2022-06-19 MED ORDER — PROPOFOL 500 MG/50ML IV EMUL
INTRAVENOUS | Status: DC | PRN
  Administered 2022-06-19: 50 ug/kg/min via INTRAVENOUS

## 2022-06-19 MED ORDER — MIDAZOLAM HCL 2 MG/2ML IJ SOLN
INTRAMUSCULAR | Status: DC | PRN
  Administered 2022-06-19: 2 mg via INTRAVENOUS

## 2022-06-19 MED ORDER — MIDAZOLAM HCL 2 MG/2ML IJ SOLN
INTRAMUSCULAR | Status: AC
  Filled 2022-06-19: qty 2

## 2022-06-19 MED ORDER — LIDOCAINE HCL (CARDIAC) PF 100 MG/5ML IV SOSY
PREFILLED_SYRINGE | INTRAVENOUS | Status: DC | PRN
  Administered 2022-06-19: 80 mg via INTRAVENOUS

## 2022-06-19 MED ORDER — PROPOFOL 10 MG/ML IV BOLUS
INTRAVENOUS | Status: DC | PRN
  Administered 2022-06-19: 10 mg via INTRAVENOUS
  Administered 2022-06-19: 40 mg via INTRAVENOUS

## 2022-06-19 MED ORDER — SODIUM CHLORIDE 0.9 % IV SOLN: INTRAVENOUS | Status: DC

## 2022-06-19 NOTE — Op Note (Signed)
[facility] Gastroenterology Patient Name: Sabrina Christian Procedure Date: 06/19/2022 11:10 AM MRN: [id number] Account #: [id number] Date of Birth: 03-Nov-1950 Admit Type: Outpatient Age: 71 Room: [facility] ENDO ROOM 1 Gender: Female Note Status: Finalized Instrument Name: Jasper Riling [id number] Procedure:             Colonoscopy Indications:           Colon cancer screening in patient at increased risk:                         Family history of 1st-degree relative with colon polyps Providers:             Andrey Farmer MD, MD Medicines:             Monitored Anesthesia Care Complications:         No immediate complications. Estimated blood loss:                         Minimal. Procedure:             Pre-Anesthesia Assessment:                        - Prior to the procedure, a History and Physical was                         performed, and patient medications and allergies were                         reviewed. The patient is competent. The risks and                         benefits of the procedure and the sedation options and                         risks were discussed with the patient. All questions                         were answered and informed consent was obtained.                         Patient identification and proposed procedure were                         verified by the physician, the nurse, the                         anesthesiologist, the anesthetist and the technician                         in the endoscopy suite. Mental Status Examination:                         alert and oriented. Airway Examination: normal                         oropharyngeal airway and neck mobility. Respiratory                         Examination: clear to auscultation. CV Examination:  normal. Prophylactic Antibiotics: The patient does not                         require prophylactic antibiotics. Prior                         Anticoagulants: The patient  has taken no previous                         anticoagulant or antiplatelet agents. ASA Grade                         Assessment: II - A patient with mild systemic disease.                         After reviewing the risks and benefits, the patient                         was deemed in satisfactory condition to undergo the                         procedure. The anesthesia plan was to use monitored                         anesthesia care (MAC). Immediately prior to                         administration of medications, the patient was                         re-assessed for adequacy to receive sedatives. The                         heart rate, respiratory rate, oxygen saturations,                         blood pressure, adequacy of pulmonary ventilation, and                         response to care were monitored throughout the                         procedure. The physical status of the patient was                         re-assessed after the procedure.                        After obtaining informed consent, the colonoscope was                         passed under direct vision. Throughout the procedure,                         the patient's blood pressure, pulse, and oxygen                         saturations were monitored continuously. The  Colonoscope was introduced through the anus and                         advanced to the the cecum, identified by appendiceal                         orifice and ileocecal valve. The colonoscopy was                         performed without difficulty. The patient tolerated                         the procedure well. The quality of the bowel                         preparation was good. Findings:      The perianal and digital rectal examinations were normal.      A 2 mm polyp was found in the cecum. The polyp was sessile. The polyp       was removed with a jumbo cold forceps. Resection and retrieval were       complete.  Estimated blood loss was minimal.      A 2 mm polyp was found in the hepatic flexure. The polyp was sessile.       The polyp was removed with a jumbo cold forceps. Resection and retrieval       were complete. Estimated blood loss was minimal.      Multiple small and large-mouthed diverticula were found in the sigmoid       colon.      Internal hemorrhoids were found during retroflexion. The hemorrhoids       were Grade I (internal hemorrhoids that do not prolapse).      The exam was otherwise without abnormality on direct and retroflexion       views. Impression:            - One 2 mm polyp in the cecum, removed with a jumbo                         cold forceps. Resected and retrieved.                        - One 2 mm polyp at the hepatic flexure, removed with                         a jumbo cold forceps. Resected and retrieved.                        - Diverticulosis in the sigmoid colon.                        - Internal hemorrhoids.                        - The examination was otherwise normal on direct and                         retroflexion views. Recommendation:        - Discharge patient to home.                        -  Resume previous diet.                        - Continue present medications.                        - Await pathology results.                        - Repeat colonoscopy is not recommended due to current                         age (74 years or older) for surveillance.                        - Return to referring physician as previously                         scheduled. Procedure Code(s):     --- Professional ---                        [phone], Colonoscopy, flexible; with biopsy, single or                         multiple Diagnosis Code(s):     --- Professional ---                        Z83.71, Family history of colonic polyps                        K63.5, Polyp of colon                        K64.0, First degree hemorrhoids                        K57.30,  Diverticulosis of large intestine without                         perforation or abscess without bleeding CPT copyright 2019 American Medical Association. All rights reserved. The codes documented in this report are preliminary and upon coder review may  be revised to meet current compliance requirements. Andrey Farmer MD, MD 06/19/2022 11:39:20 AM Number of Addenda: 0 Note Initiated On: 06/19/2022 11:10 AM Scope Withdrawal Time: 0 hours 6 minutes 52 seconds  Total Procedure Duration: 0 hours 13 minutes 2 seconds  Estimated Blood Loss:  Estimated blood loss was minimal.      [facility]

## 2022-06-19 NOTE — Interval H&P Note (Signed)
History and Physical Interval Note:  06/19/2022 11:12 AM  Sabrina Christian  has presented today for surgery, with the diagnosis of FH Colon Polyps.  The various methods of treatment have been discussed with the patient and family. After consideration of risks, benefits and other options for treatment, the patient has consented to  Procedure(s): COLONOSCOPY WITH PROPOFOL (N/A) as a surgical intervention.  The patient's history has been reviewed, patient examined, no change in status, stable for surgery.  I have reviewed the patient's chart and labs.  Questions were answered to the patient's satisfaction.     Lesly Rubenstein  Ok to proceed with colonoscopy

## 2022-06-19 NOTE — H&P (Signed)
Outpatient short stay form Pre-procedure 06/19/2022  Lesly Rubenstein, MD  Primary Physician: Lynnell Jude, MD  Reason for visit:  Screening  History of present illness:    71 y/o lady with history of hypothyroidism, anxiety, and headaches here for colonoscopy for family history of polyps. No blood thinners. Last colonoscopy was about 10 years ago. History of hysterectomy.   No current facility-administered medications for this encounter.  Medications Prior to Admission  Medication Sig Dispense Refill Last Dose   escitalopram (LEXAPRO) 5 MG tablet Take 5 mg by mouth daily.    06/18/2022   losartan (COZAAR) 50 MG tablet Take 50 mg by mouth daily.   06/18/2022   SYNTHROID 112 MCG tablet Take 112 mcg daily before breakfast by mouth.   06/19/2022   ALPRAZolam (XANAX) 0.5 MG tablet Take 0.5 mg 2 (two) times daily as needed by mouth for anxiety (for anxiety.).   2    aspirin 81 MG EC tablet Take by mouth.      benzonatate (TESSALON) 100 MG capsule Take 1 capsule (100 mg total) by mouth 3 (three) times daily as needed for cough. 21 capsule 0    butalbital-acetaminophen-caffeine (FIORICET, ESGIC) 50-325-40 MG tablet Take 1 tablet 2 (two) times daily as needed by mouth for headache or migraine.      cyanocobalamin (,VITAMIN B-12,) 1000 MCG/ML injection Inject 1,000 mcg every 30 (thirty) days into the muscle.      Glucosamine-Chondroitin (MOVE FREE PO) Take 1 tablet by mouth daily at 2 PM.       ibuprofen (ADVIL,MOTRIN) 200 MG tablet Take 400 mg by mouth every 8 (eight) hours as needed (pain.).       MAGNESIUM PO Take 1 tablet by mouth daily at 2 PM. In the afternoon.      Multiple Vitamin (MULTIVITAMIN WITH MINERALS) TABS tablet Take 1 tablet by mouth daily at 2 PM. In the afternoon.      Multiple Vitamins-Minerals (ZINC PO) Take 1 tablet by mouth daily at 2 PM. In the afternoon.        Allergies  Allergen Reactions   Penicillins Hives and Swelling    Has patient had a PCN reaction  causing immediate rash, facial/tongue/throat swelling, SOB or lightheadedness with hypotension: Yes Has patient had a PCN reaction causing severe rash involving mucus membranes or skin necrosis: No Has patient had a PCN reaction that required hospitalization: No Has patient had a PCN reaction occurring within the last 10 years: No If all of the above answers are "NO", then may proceed with Cephalosporin use.    Codeine Nausea And Vomiting   Hctz [Hydrochlorothiazide] Other (See Comments)    Blisters on top/bottom of feet.   Sulfa Antibiotics Hives and Itching     Past Medical History:  Diagnosis Date   Anemia    H/O   Anxiety    Complication of anesthesia    Family history of adverse reaction to anesthesia    MOM-N/V   GERD (gastroesophageal reflux disease)    OCC-TAKES DGL CHEWABLE   Headache    Hypertension    Hypothyroidism    PONV (postoperative nausea and vomiting)    DID NOT GET SICK WITH BACK SURGERY    Review of systems:  Otherwise negative.    Physical Exam  Gen: Alert, oriented. Appears stated age.  HEENT: PERRLA. Lungs: No respiratory distress CV: RRR Abd: soft, benign, no masses Ext: No edema    Planned procedures: Proceed with colonoscopy. The patient understands  the nature of the planned procedure, indications, risks, alternatives and potential complications including but not limited to bleeding, infection, perforation, damage to internal organs and possible oversedation/side effects from anesthesia. The patient agrees and gives consent to proceed.  Please refer to procedure notes for findings, recommendations and patient disposition/instructions.     Lesly Rubenstein, MD [facility] Gastroenterology

## 2022-06-19 NOTE — Anesthesia Preprocedure Evaluation (Signed)
Anesthesia Evaluation  Patient identified by MRN, date of birth, ID band Patient awake    Reviewed: Allergy & Precautions, NPO status , Patient's Chart, lab work & pertinent test results  History of Anesthesia Complications (+) PONV and history of anesthetic complications  Airway Mallampati: III  TM Distance: >3 FB Neck ROM: full    Dental  (+) Teeth Intact   Pulmonary neg pulmonary ROS,    Pulmonary exam normal        Cardiovascular hypertension, On Medications negative cardio ROS Normal cardiovascular exam     Neuro/Psych  Headaches, PSYCHIATRIC DISORDERS Anxiety    GI/Hepatic Neg liver ROS, GERD  ,  Endo/Other  Hypothyroidism   Renal/GU negative Renal ROS  negative genitourinary   Musculoskeletal   Abdominal   Peds  Hematology negative hematology ROS (+)   Anesthesia Other Findings Past Medical History: No date: Anemia     Comment:  H/O No date: Anxiety No date: Complication of anesthesia No date: Family history of adverse reaction to anesthesia     Comment:  MOM-N/V No date: GERD (gastroesophageal reflux disease)     Comment:  OCC-TAKES DGL CHEWABLE No date: Headache No date: Hypertension No date: Hypothyroidism No date: PONV (postoperative nausea and vomiting)     Comment:  DID NOT GET SICK WITH BACK SURGERY  Past Surgical History: No date: ABDOMINAL HYSTERECTOMY 2014: BACK SURGERY     Comment:  LAMINECTOMY No date: CARPAL TUNNEL RELEASE; Right No date: COLONOSCOPY No date: COLONOSCOPY 07/30/2017: SHOULDER ARTHROSCOPY WITH ROTATOR CUFF REPAIR; Right     Comment:  Procedure: SHOULDER ARTHROSCOPY WITH DEBRIDEMENT,               DECOMPRESSION, REPAIR OF THE ROTATOR CUFF TEAR WITH A               REGENERATION PATCH AND BICEPS TENODESIS;  Surgeon: Corky Mull, MD;  Location: [facility] ORS;  Service: Orthopedics;                Laterality: Right; 06/18/2019: TOTAL KNEE ARTHROPLASTY;  Right     Comment:  Procedure: TOTAL KNEE ARTHROPLASTY;  Surgeon: Corky Mull, MD;  Location: [facility] ORS;  Service: Orthopedics;                Laterality: Right;  BMI    Body Mass Index: 27.98 kg/m      Reproductive/Obstetrics negative OB ROS                             Anesthesia Physical Anesthesia Plan  ASA: 2  Anesthesia Plan: General   Post-op Pain Management: Minimal or no pain anticipated   Induction: Intravenous  PONV Risk Score and Plan: Propofol infusion and TIVA  Airway Management Planned: Natural Airway and Nasal Cannula  Additional Equipment:   Intra-op Plan:   Post-operative Plan:   Informed Consent: I have reviewed the patients History and Physical, chart, labs and discussed the procedure including the risks, benefits and alternatives for the proposed anesthesia with the patient or authorized representative who has indicated his/her understanding and acceptance.     Dental Advisory Given  Plan Discussed with: Anesthesiologist, CRNA and Surgeon  Anesthesia Plan Comments: (Patient consented for risks of anesthesia including but not limited to:  - adverse reactions to medications -  risk of airway placement if required - damage to eyes, teeth, lips or other oral mucosa - nerve damage due to positioning  - sore throat or hoarseness - Damage to heart, brain, nerves, lungs, other parts of body or loss of life  Patient voiced understanding.)        Anesthesia Quick Evaluation

## 2022-06-19 NOTE — Transfer of Care (Signed)
Immediate Anesthesia Transfer of Care Note  Patient: Sabrina Christian  Procedure(s) Performed: COLONOSCOPY WITH PROPOFOL  Patient Location: PACU  Anesthesia Type:General  Level of Consciousness: sedated  Airway & Oxygen Therapy: Patient Spontanous Breathing  Post-op Assessment: Report given to RN and Post -op Vital signs reviewed and stable  Post vital signs: Reviewed and stable  Last Vitals:  Vitals Value Taken Time  BP 111/78 06/19/22 1141  Temp    Pulse 92 06/19/22 1141  Resp 11 06/19/22 1141  SpO2 100 % 06/19/22 1141  Vitals shown include unvalidated device data.  Last Pain:  Vitals:   06/19/22 1105  TempSrc: Temporal  PainSc: 0-No pain         Complications: No notable events documented.

## 2022-06-19 NOTE — Anesthesia Postprocedure Evaluation (Signed)
Anesthesia Post Note  Patient: Sabrina Christian  Procedure(s) Performed: COLONOSCOPY WITH PROPOFOL  Patient location during evaluation: Endoscopy Anesthesia Type: General Level of consciousness: awake and alert Pain management: pain level controlled Vital Signs Assessment: post-procedure vital signs reviewed and stable Respiratory status: spontaneous breathing, nonlabored ventilation, respiratory function stable and patient connected to nasal cannula oxygen Cardiovascular status: blood pressure returned to baseline and stable Postop Assessment: no apparent nausea or vomiting Anesthetic complications: no   No notable events documented.   Last Vitals:  Vitals:   06/19/22 1149 06/19/22 1159  BP: 128/76 131/76  Pulse: 89   Resp: 14   Temp:    SpO2: 98% 97%    Last Pain:  Vitals:   06/19/22 1159  TempSrc:   PainSc: 0-No pain                 Ilene Qua

## 2022-06-20 LAB — SURGICAL PATHOLOGY

## 2022-06-21 ENCOUNTER — Encounter: Payer: Self-pay | Admitting: Gastroenterology

## 2022-08-14 ENCOUNTER — Ambulatory Visit
Admission: RE | Admit: 2022-08-14 | Discharge: 2022-08-14 | Disposition: A | Discharge status: Home / Self Care | Payer: Medicare Other | Attending: Family Medicine | Admitting: Family Medicine

## 2022-08-14 ENCOUNTER — Other Ambulatory Visit: Payer: Self-pay | Admitting: Family Medicine

## 2022-08-14 ENCOUNTER — Ambulatory Visit
Admission: RE | Admit: 2022-08-14 | Discharge: 2022-08-14 | Disposition: A | Discharge status: Home / Self Care | Payer: Medicare Other | Source: Ambulatory Visit | Attending: Family Medicine | Admitting: Family Medicine

## 2022-08-14 DIAGNOSIS — R059 Cough, unspecified: Secondary | ICD-10-CM | POA: Diagnosis present

## 2022-08-14 DIAGNOSIS — J159 Unspecified bacterial pneumonia: Secondary | ICD-10-CM | POA: Diagnosis present

## 2023-03-22 ENCOUNTER — Other Ambulatory Visit: Payer: Self-pay | Admitting: Family Medicine

## 2023-03-22 ENCOUNTER — Encounter: Payer: Self-pay | Admitting: Family Medicine

## 2023-03-22 ENCOUNTER — Encounter (HOSPITAL_BASED_OUTPATIENT_CLINIC_OR_DEPARTMENT_OTHER): Payer: Self-pay | Admitting: Family Medicine

## 2023-03-22 ENCOUNTER — Other Ambulatory Visit (HOSPITAL_COMMUNITY): Payer: Self-pay | Admitting: Family Medicine

## 2023-03-22 DIAGNOSIS — R29818 Other symptoms and signs involving the nervous system: Secondary | ICD-10-CM

## 2023-03-22 DIAGNOSIS — Z789 Other specified health status: Secondary | ICD-10-CM

## 2023-03-22 DIAGNOSIS — G459 Transient cerebral ischemic attack, unspecified: Secondary | ICD-10-CM

## 2023-03-29 ENCOUNTER — Ambulatory Visit
Admission: RE | Admit: 2023-03-29 | Discharge: 2023-03-29 | Disposition: A | Discharge status: Home / Self Care | Payer: Medicare Other | Source: Ambulatory Visit | Attending: Family Medicine | Admitting: Family Medicine

## 2023-03-29 DIAGNOSIS — G459 Transient cerebral ischemic attack, unspecified: Secondary | ICD-10-CM | POA: Insufficient documentation

## 2023-03-29 DIAGNOSIS — R29818 Other symptoms and signs involving the nervous system: Secondary | ICD-10-CM | POA: Diagnosis present

## 2023-04-12 ENCOUNTER — Other Ambulatory Visit: Payer: Self-pay | Admitting: Family Medicine

## 2023-04-12 DIAGNOSIS — Z1231 Encounter for screening mammogram for malignant neoplasm of breast: Secondary | ICD-10-CM

## 2023-04-23 ENCOUNTER — Ambulatory Visit
Admission: RE | Admit: 2023-04-23 | Discharge: 2023-04-23 | Disposition: A | Discharge status: Home / Self Care | Payer: Medicare Other | Source: Ambulatory Visit | Attending: Family Medicine | Admitting: Family Medicine

## 2023-04-23 DIAGNOSIS — Z1231 Encounter for screening mammogram for malignant neoplasm of breast: Secondary | ICD-10-CM | POA: Diagnosis not present

## 2023-07-19 IMAGING — MG MM DIGITAL SCREENING BILAT W/ TOMO AND CAD
8 series · 8 of 24 positions shown · non-contrast
Comparison: Previous exam(s).

ACR Breast Density Category a: The breast tissue is almost entirely
fatty.

CLINICAL DATA: Screening.

EXAM:
DIGITAL SCREENING BILATERAL MAMMOGRAM WITH TOMOSYNTHESIS AND CAD
TECHNIQUE: Bilateral screening digital craniocaudal and mediolateral oblique
mammograms were obtained. Bilateral screening digital breast
tomosynthesis was performed. The images were evaluated with
computer-aided detection.

[R CC synth-2D]
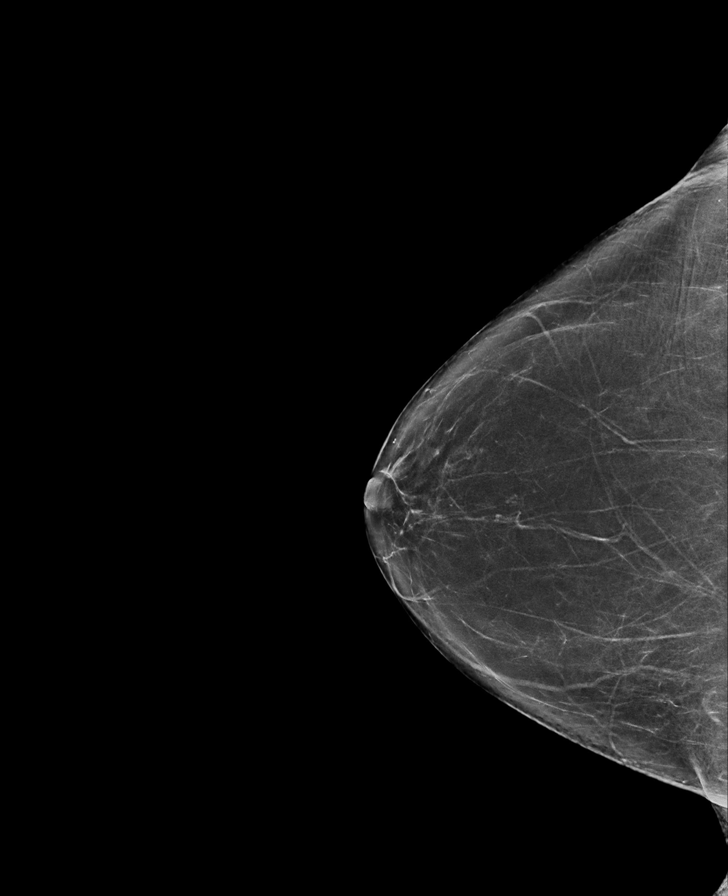

[L CC synth-2D]
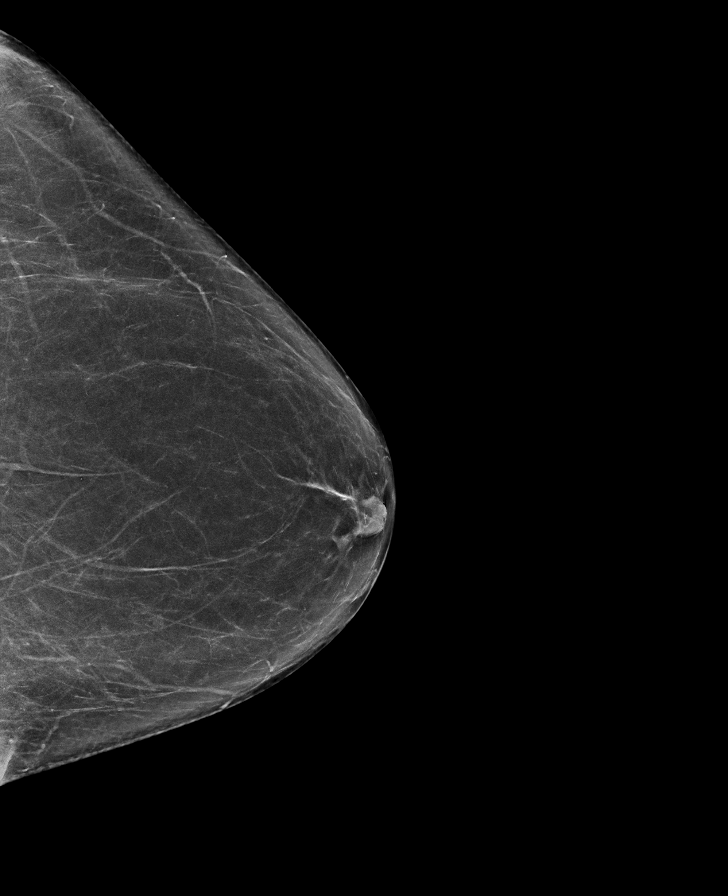

[R MLO synth-2D]
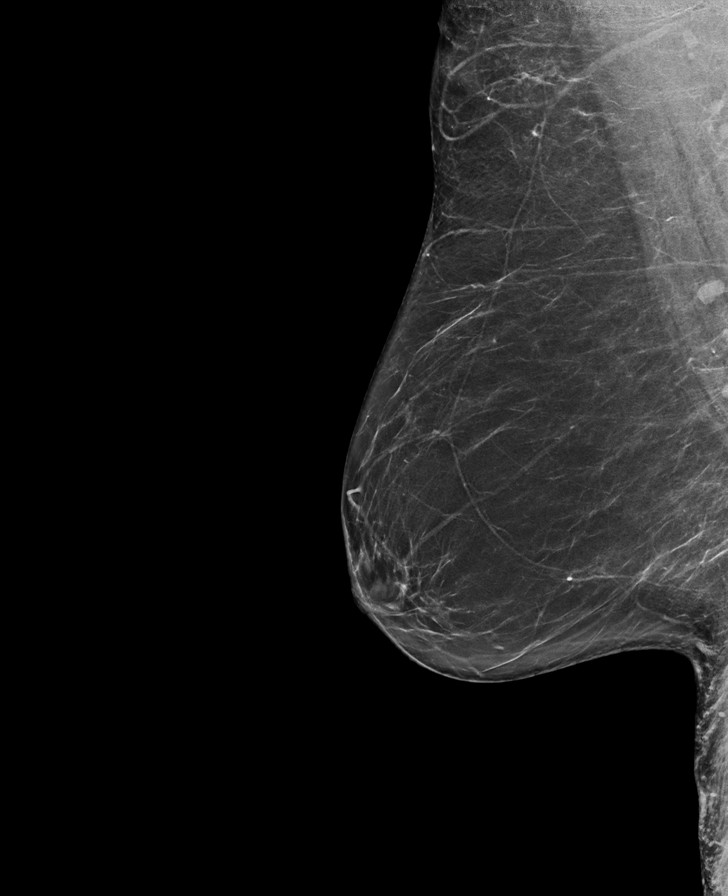

[L MLO synth-2D]
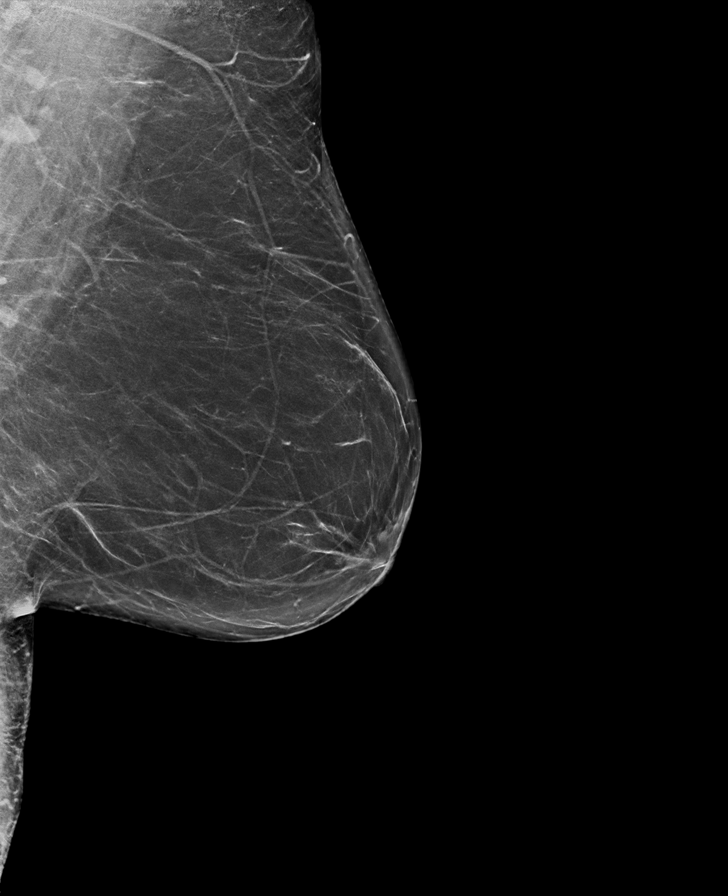

[R CC tomo · tomo slice 35/69.0]
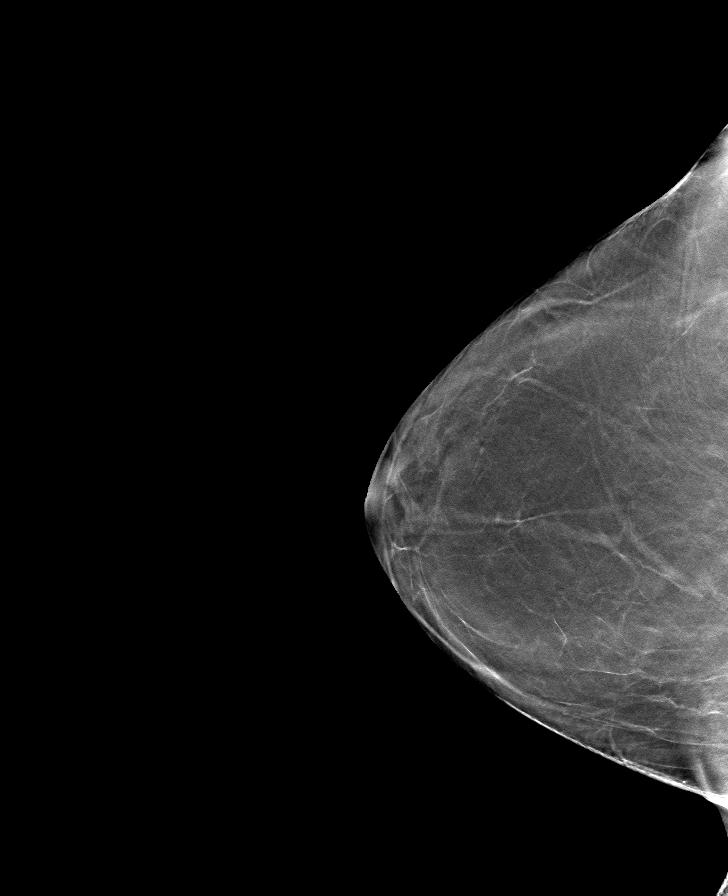

[R MLO tomo · tomo slice 38/75.0]
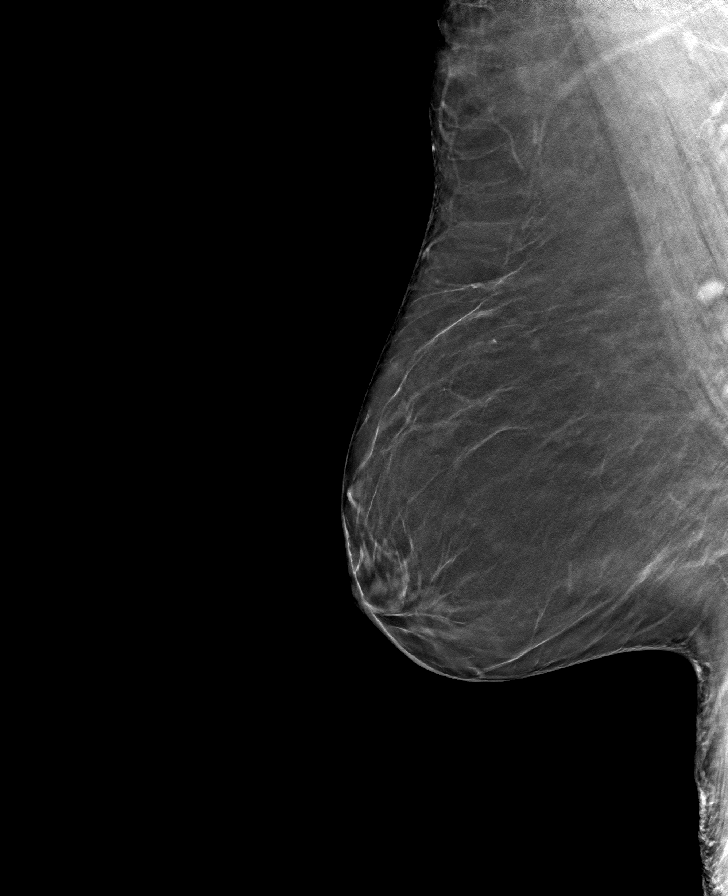

[L CC tomo · tomo slice 35/70.0]
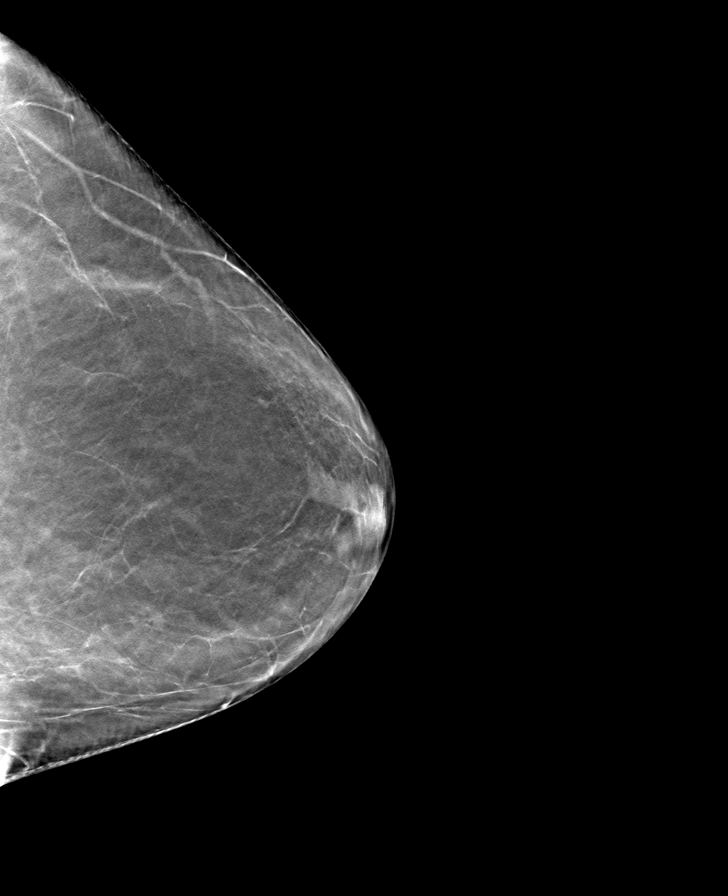

[L MLO tomo · tomo slice 39/76.0]
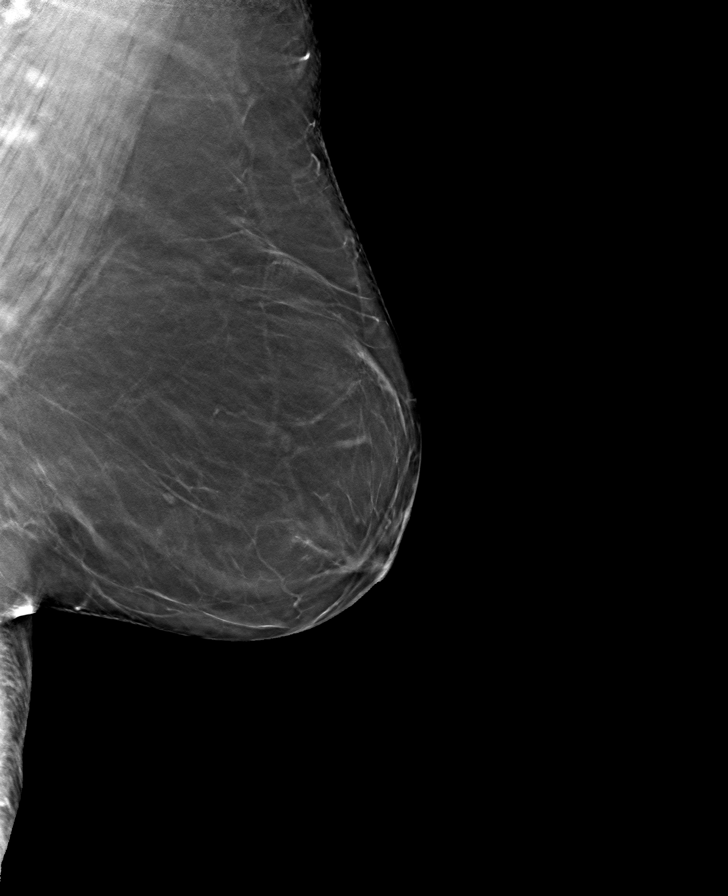

[8 of 24 positions shown; findings below may reference images not displayed]

FINDINGS: There are no findings suspicious for malignancy.
IMPRESSION: No mammographic evidence of malignancy. A result letter of this
screening mammogram will be mailed directly to the patient.

RECOMMENDATION:
Screening mammogram in one year. (Code:0E-3-N98)

BI-RADS CATEGORY  1: Negative.

## 2023-07-23 ENCOUNTER — Other Ambulatory Visit: Payer: Self-pay | Admitting: Family Medicine

## 2023-07-23 DIAGNOSIS — M8580 Other specified disorders of bone density and structure, unspecified site: Secondary | ICD-10-CM

## 2023-07-23 DIAGNOSIS — R9389 Abnormal findings on diagnostic imaging of other specified body structures: Secondary | ICD-10-CM

## 2023-08-02 ENCOUNTER — Ambulatory Visit
Admission: RE | Admit: 2023-08-02 | Discharge: 2023-08-02 | Disposition: A | Discharge status: Home / Self Care | Payer: Medicare Other | Source: Ambulatory Visit | Attending: Family Medicine | Admitting: Family Medicine

## 2023-08-02 DIAGNOSIS — R9389 Abnormal findings on diagnostic imaging of other specified body structures: Secondary | ICD-10-CM | POA: Diagnosis present

## 2023-08-02 MED ORDER — IOHEXOL 300 MG/ML  SOLN
75.0000 mL | Freq: Once | INTRAMUSCULAR | Status: AC | PRN
  Administered 2023-08-02: 75 mL via INTRAVENOUS

## 2023-10-16 ENCOUNTER — Ambulatory Visit
Admission: RE | Admit: 2023-10-16 | Discharge: 2023-10-16 | Disposition: A | Discharge status: Home / Self Care | Payer: Medicare Other | Source: Ambulatory Visit | Attending: Family Medicine | Admitting: Family Medicine

## 2023-10-16 DIAGNOSIS — M8580 Other specified disorders of bone density and structure, unspecified site: Secondary | ICD-10-CM | POA: Insufficient documentation

## 2023-10-16 DIAGNOSIS — Z780 Asymptomatic menopausal state: Secondary | ICD-10-CM | POA: Insufficient documentation

## 2023-10-16 DIAGNOSIS — Z13820 Encounter for screening for osteoporosis: Secondary | ICD-10-CM | POA: Insufficient documentation

## 2023-10-16 DIAGNOSIS — M85851 Other specified disorders of bone density and structure, right thigh: Secondary | ICD-10-CM | POA: Diagnosis not present

## 2023-10-16 DIAGNOSIS — E039 Hypothyroidism, unspecified: Secondary | ICD-10-CM | POA: Insufficient documentation

## 2023-10-16 DIAGNOSIS — Z8262 Family history of osteoporosis: Secondary | ICD-10-CM | POA: Diagnosis not present

## 2023-10-16 DIAGNOSIS — M8589 Other specified disorders of bone density and structure, multiple sites: Secondary | ICD-10-CM | POA: Diagnosis not present

## 2023-12-15 ENCOUNTER — Emergency Department
Admission: EM | Admit: 2023-12-15 | Discharge: 2023-12-15 | Disposition: A | Discharge status: Home / Self Care | Attending: Emergency Medicine | Admitting: Emergency Medicine

## 2023-12-15 ENCOUNTER — Emergency Department

## 2023-12-15 ENCOUNTER — Other Ambulatory Visit: Payer: Self-pay

## 2023-12-15 DIAGNOSIS — Z96651 Presence of right artificial knee joint: Secondary | ICD-10-CM | POA: Insufficient documentation

## 2023-12-15 DIAGNOSIS — W108XXA Fall (on) (from) other stairs and steps, initial encounter: Secondary | ICD-10-CM | POA: Diagnosis not present

## 2023-12-15 DIAGNOSIS — I10 Essential (primary) hypertension: Secondary | ICD-10-CM | POA: Insufficient documentation

## 2023-12-15 DIAGNOSIS — S8001XA Contusion of right knee, initial encounter: Secondary | ICD-10-CM

## 2023-12-15 DIAGNOSIS — M25561 Pain in right knee: Secondary | ICD-10-CM | POA: Diagnosis present

## 2023-12-15 DIAGNOSIS — S81011A Laceration without foreign body, right knee, initial encounter: Secondary | ICD-10-CM | POA: Diagnosis not present

## 2023-12-15 DIAGNOSIS — E039 Hypothyroidism, unspecified: Secondary | ICD-10-CM | POA: Insufficient documentation

## 2023-12-15 DIAGNOSIS — W19XXXA Unspecified fall, initial encounter: Secondary | ICD-10-CM

## 2023-12-15 MED ORDER — LIDOCAINE HCL (PF) 1 % IJ SOLN
10.0000 mL | Freq: Once | INTRAMUSCULAR | Status: AC
  Administered 2023-12-15: 10 mL
  Filled 2023-12-15: qty 10

## 2023-12-15 NOTE — Discharge Instructions (Signed)
 Your exam and x-rays are normal and reassuring at this time.  Your x-ray did not show any fracture or disruption of the hardware.  Your laceration has been repaired with sutures.  Keep the wound clean, dry, and covered.  See your primary provider in 10 to 12 days for suture removal.

## 2023-12-15 NOTE — ED Provider Notes (Signed)
 [facility] Emergency Department Provider Note     Event Date/Time   First MD Initiated Contact with Patient 12/15/23 1329     (approximate)   History   Fall   HPI  Sabrina Christian is a 73 y.o. female with a history of HTN, anxiety, hypothyroid, GERD, and total knee replacement on the right, [place] the ED [place] [facility] from [facility].  Patient reports a mechanical fall she was walking down the steps.  She apparently landed on her right knee.  She denies any head injury or LOC.  She endorses only 2 out of 10 pain at this time.  Vital signs reported stable from EMS.  Patient is ambulatory to the ED for evaluation of acute right knee pain following fall.  Physical Exam   Triage Vital Signs: ED Triage Vitals [12/15/23 1309]  Encounter Vitals Group     BP (!) 139/116     Systolic BP Percentile      Diastolic BP Percentile      Pulse Rate 94     Resp 20     Temp 98.7 F (37.1 C)     Temp Source Oral     SpO2 98 %     Weight      Height      Head Circumference      Peak Flow      Pain Score 2     Pain Loc      Pain Education      Exclude from Growth Chart     Most recent vital signs: Vitals:   12/15/23 1309  BP: (!) 139/116  Pulse: 94  Resp: 20  Temp: 98.7 F (37.1 C)  SpO2: 98%    General Awake, no distress. NAD HEENT NCAT. PERRL. EOMI. No rhinorrhea. Mucous membranes are moist.  CV:  Good peripheral perfusion. RRR RESP:  Normal effort. CTA MSK:  Right knee without obvious deformity or dislocation.  Active range of motion appreciated.  9 cm oblique laceration over the anterior knee.   ED Results / Procedures / Treatments   Labs (all labs ordered are listed, but only abnormal results are displayed) Labs Reviewed - No data to display   EKG  RADIOLOGY  I personally viewed and evaluated these images as part of my medical decision making, as well as reviewing the written report by the radiologist.  ED Provider Interpretation: No  acute hardware or bony findings  DG Knee Complete 4 Views Right Result Date: 12/15/2023 CLINICAL DATA:  Fall.  Right knee pain. EXAM: RIGHT KNEE - COMPLETE 4+ VIEW COMPARISON:  06/18/2019 FINDINGS: Status post tricompartmental knee replacement. No evidence for acute fracture or dislocation. No joint effusion. No hardware complication. No worrisome lytic or sclerotic osseous abnormality. IMPRESSION: Status post tricompartmental knee replacement without hardware complication or acute bony findings. Electronically Signed   By: Kennith Center M.D.   On: 12/15/2023 14:06    PROCEDURES:  Critical Care performed: No  .Laceration Repair  Date/Time: 12/15/2023 3:01 PM  Performed by: Lissa Hoard, PA-C Authorized by: Lissa Hoard, PA-C   Consent:    Consent obtained:  Verbal   Consent given by:  Patient   Risks discussed:  Pain and poor wound healing Universal protocol:    Imaging studies available: yes     Site/side marked: yes     Patient identity confirmed:  Verbally with patient Anesthesia:    Anesthesia method:  Local infiltration  Local anesthetic:  Lidocaine 1% w/o epi Laceration details:    Location:  Leg   Leg location:  R knee   Length (cm):  5   Depth (mm):  5 Pre-procedure details:    Preparation:  Patient was prepped and draped in usual sterile fashion Exploration:    Limited defect created (wound extended): no     Hemostasis achieved with:  Direct pressure   Contaminated: no   Treatment:    Area cleansed with:  Saline   Amount of cleaning:  Standard   Irrigation solution:  Sterile saline   Irrigation volume:  10   Irrigation method:  Syringe   Debridement:  None   Undermining:  None   Scar revision: no   Skin repair:    Repair method:  Sutures   Suture size:  3-0   Suture material:  Nylon   Suture technique:  Simple interrupted   Number of sutures:  9 Approximation:    Approximation:  Close Repair type:    Repair type:   Simple Post-procedure details:    Dressing:  Non-adherent dressing   Procedure completion:  Tolerated well, no immediate complications    MEDICATIONS ORDERED IN ED: Medications  lidocaine (PF) (XYLOCAINE) 1 % injection 10 mL (has no administration in time range)     IMPRESSION / MDM / ASSESSMENT AND PLAN / ED COURSE  I reviewed the triage vital signs and the nursing notes.                              Differential diagnosis includes, but is not limited to, fracture, dislocation, hardware disruption, periprosthetic fracture, sprain, contusion  Patient's presentation is most consistent with acute complicated illness / injury requiring diagnostic workup.  Patient's diagnosis is consistent with mechanical fall resulting in knee contusion and laceration.  No radiologic evidence of any acute fracture or hardware disruption my interpretation of images.  Patient consents to wound repair which deform using nylon sutures.  Good wound edge approximation is achieved.  Patient will be discharged home with wound care instructions.  Patient is to follow up with her PCP for suture movable in 10 to 12 days as discussed, as needed or otherwise directed. Patient is given ED precautions to return to the ED for any worsening or new symptoms.  FINAL CLINICAL IMPRESSION(S) / ED DIAGNOSES   Final diagnoses:  Knee laceration, right, initial encounter  Contusion of right knee, initial encounter  Fall, initial encounter     Rx / DC Orders   ED Discharge Orders     None        Note:  This document was prepared using Dragon voice recognition software and may include unintentional dictation errors.    Lissa Hoard, PA-C 12/15/23 1556    Minna Antis, MD 12/15/23 1948

## 2023-12-15 NOTE — ED Notes (Signed)
 62 yof with a c/c of right sided knee pain due to [place] today. The pt wanted to be checked out due to her having a total knee replacement on that knee. The pt Is alert and oriented x 4. The pt was warm, pink, and dry. Call bell left at bedside.

## 2023-12-15 NOTE — ED Triage Notes (Signed)
 Pt to ED via [facility] from [facility]. Pt was walking down steps from the stage [place] and fell. Pt reports pain to right knee. 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 DP pulse and strong. Pt reports pain 2/10.  EMS VS: BP 150/90 95 HR  18 RR CBG 95 95% RA

## 2024-05-04 ENCOUNTER — Other Ambulatory Visit: Payer: Self-pay | Admitting: Family Medicine

## 2024-05-04 DIAGNOSIS — Z1231 Encounter for screening mammogram for malignant neoplasm of breast: Secondary | ICD-10-CM

## 2024-06-01 ENCOUNTER — Ambulatory Visit
Admission: RE | Admit: 2024-06-01 | Discharge: 2024-06-01 | Disposition: A | Discharge status: Home / Self Care | Source: Ambulatory Visit | Attending: Family Medicine | Admitting: Family Medicine

## 2024-06-01 DIAGNOSIS — Z1231 Encounter for screening mammogram for malignant neoplasm of breast: Secondary | ICD-10-CM | POA: Insufficient documentation
